# Patient Record
Sex: Female | Born: 1955 | Race: White | Hispanic: No | Smoking: Former smoker
Health system: Southern US, Community
[De-identification: ages and names within clinical notes are randomized; demographics above are authoritative.]

## PROBLEM LIST (undated history)

## (undated) DIAGNOSIS — F32A Depression, unspecified: Secondary | ICD-10-CM

## (undated) DIAGNOSIS — F329 Major depressive disorder, single episode, unspecified: Secondary | ICD-10-CM

## (undated) HISTORY — PX: TONSILLECTOMY: SUR1361

## (undated) HISTORY — PX: GALLBLADDER SURGERY: SHX652

## (undated) HISTORY — DX: Major depressive disorder, single episode, unspecified: F32.9

## (undated) HISTORY — DX: Depression, unspecified: F32.A

---

## 1998-03-15 ENCOUNTER — Ambulatory Visit (HOSPITAL_COMMUNITY): Admission: RE | Admit: 1998-03-15 | Discharge: 1998-03-15 | Payer: Self-pay | Admitting: Family Medicine

## 1999-01-17 ENCOUNTER — Ambulatory Visit (HOSPITAL_COMMUNITY): Admission: RE | Admit: 1999-01-17 | Discharge: 1999-01-17 | Payer: Self-pay | Admitting: Family Medicine

## 1999-01-17 ENCOUNTER — Encounter: Payer: Self-pay | Admitting: Family Medicine

## 2000-01-21 ENCOUNTER — Ambulatory Visit (HOSPITAL_COMMUNITY): Admission: RE | Admit: 2000-01-21 | Discharge: 2000-01-21 | Payer: Self-pay | Admitting: Family Medicine

## 2000-01-21 ENCOUNTER — Encounter: Payer: Self-pay | Admitting: Family Medicine

## 2001-02-04 ENCOUNTER — Encounter: Payer: Self-pay | Admitting: Family Medicine

## 2001-02-04 ENCOUNTER — Ambulatory Visit (HOSPITAL_COMMUNITY): Admission: RE | Admit: 2001-02-04 | Discharge: 2001-02-04 | Payer: Self-pay | Admitting: Family Medicine

## 2001-03-01 ENCOUNTER — Other Ambulatory Visit: Admission: RE | Admit: 2001-03-01 | Discharge: 2001-03-01 | Payer: Self-pay | Admitting: *Deleted

## 2002-03-16 ENCOUNTER — Ambulatory Visit (HOSPITAL_COMMUNITY): Admission: RE | Admit: 2002-03-16 | Discharge: 2002-03-16 | Payer: Self-pay | Admitting: Family Medicine

## 2002-03-16 ENCOUNTER — Encounter: Payer: Self-pay | Admitting: Family Medicine

## 2002-03-29 ENCOUNTER — Other Ambulatory Visit: Admission: RE | Admit: 2002-03-29 | Discharge: 2002-03-29 | Payer: Self-pay | Admitting: Family Medicine

## 2002-05-19 ENCOUNTER — Ambulatory Visit (HOSPITAL_COMMUNITY): Admission: RE | Admit: 2002-05-19 | Discharge: 2002-05-19 | Payer: Self-pay | Admitting: Obstetrics and Gynecology

## 2002-05-19 ENCOUNTER — Encounter (INDEPENDENT_AMBULATORY_CARE_PROVIDER_SITE_OTHER): Payer: Self-pay

## 2003-03-24 ENCOUNTER — Ambulatory Visit (HOSPITAL_COMMUNITY): Admission: RE | Admit: 2003-03-24 | Discharge: 2003-03-24 | Payer: Self-pay | Admitting: Family Medicine

## 2003-03-24 ENCOUNTER — Encounter: Payer: Self-pay | Admitting: Family Medicine

## 2003-04-04 ENCOUNTER — Other Ambulatory Visit: Admission: RE | Admit: 2003-04-04 | Discharge: 2003-04-04 | Payer: Self-pay | Admitting: *Deleted

## 2003-06-27 ENCOUNTER — Encounter: Admission: RE | Admit: 2003-06-27 | Discharge: 2003-09-25 | Payer: Self-pay | Admitting: Family Medicine

## 2004-04-18 ENCOUNTER — Ambulatory Visit (HOSPITAL_COMMUNITY): Admission: RE | Admit: 2004-04-18 | Discharge: 2004-04-18 | Payer: Self-pay | Admitting: Family Medicine

## 2004-05-01 ENCOUNTER — Other Ambulatory Visit: Admission: RE | Admit: 2004-05-01 | Discharge: 2004-05-01 | Payer: Self-pay | Admitting: *Deleted

## 2005-05-06 ENCOUNTER — Other Ambulatory Visit: Admission: RE | Admit: 2005-05-06 | Discharge: 2005-05-06 | Payer: Self-pay | Admitting: *Deleted

## 2006-05-05 ENCOUNTER — Ambulatory Visit (HOSPITAL_COMMUNITY): Admission: RE | Admit: 2006-05-05 | Discharge: 2006-05-05 | Payer: Self-pay | Admitting: Family Medicine

## 2006-10-21 ENCOUNTER — Ambulatory Visit (HOSPITAL_COMMUNITY): Admission: RE | Admit: 2006-10-21 | Discharge: 2006-10-21 | Payer: Self-pay | Admitting: Surgery

## 2006-10-21 ENCOUNTER — Encounter (INDEPENDENT_AMBULATORY_CARE_PROVIDER_SITE_OTHER): Payer: Self-pay | Admitting: Specialist

## 2007-06-10 ENCOUNTER — Ambulatory Visit (HOSPITAL_COMMUNITY): Admission: RE | Admit: 2007-06-10 | Discharge: 2007-06-10 | Payer: Self-pay | Admitting: Family Medicine

## 2008-06-28 ENCOUNTER — Ambulatory Visit (HOSPITAL_COMMUNITY): Admission: RE | Admit: 2008-06-28 | Discharge: 2008-06-28 | Payer: Self-pay | Admitting: Family Medicine

## 2010-04-28 ENCOUNTER — Emergency Department: Payer: Self-pay | Admitting: Unknown Physician Specialty

## 2010-10-20 ENCOUNTER — Encounter: Payer: Self-pay | Admitting: Family Medicine

## 2011-02-14 NOTE — Op Note (Signed)
Denise Reed, Denise Reed              ACCOUNT NO.:  [id number]   MEDICAL RECORD NO.:  [id number]          PATIENT TYPE:  AMB   LOCATION:  SDS                          FACILITY:  [facility]   PHYSICIAN:  Ardeth Sportsman, MD     DATE OF BIRTH:  04-May-1956   DATE OF PROCEDURE:  10/21/2006  DATE OF DISCHARGE:                               OPERATIVE REPORT   PRIMARY CARE PHYSICIAN:  Clovis Riley.   SURGEON:  Ardeth Sportsman, MD   ASSISTANT:  Lebron Conners, M.D.   PREOPERATIVE DIAGNOSIS:  Symptomatic cholecystolithiasis, possible  chronic cholecystitis.   POSTOPERATIVE DIAGNOSES:  1. Symptomatic cholecystolithiasis.  2. Chronic cholecystitis.   PROCEDURE PERFORMED:  Laparoscopic cholecystectomy   SPECIMENS:  Gallbladder.   DRAINS:  None.   ESTIMATED BLOOD LOSS:  Less than 5 mL.   COMPLICATIONS:  None.   INDICATIONS:  Denise Reed is a 55 year old female who has had episodes of  classic biliary colic with known gallstones.  She still has some chronic  low grade pain, concerning for chronic cholecystitis as well.   The anatomy and physiology of hepatobiliary and pancreatic function was  discussed.  Physiology of acute cholecystitis was explained and  recommendation was made for laparoscopic cholecystectomy with  intraoperative cholangiogram.  Risks such as stroke, heart attack, deep  venous thrombosis, pulmonary embolism, and death were discussed.  The  risks such as bleeding, need for transfusion, wound infection, abscess,  injury to other organs, incisional hernia, recurrent pain, bile duct  injury requiring internal/external or intraoperative reconstruction, and  other risks were discussed.  Questions answered.  She agreed to proceed.   OPERATIVE FINDINGS:  She had a small thick walled gallbladder with a few  stones within it.  The cystic duct lumen was essentially obliterated and  I could not get a catheter to cannulate more than 5 mm so cholangiogram  could not be done.   DESCRIPTION OF PROCEDURE:  Informed consent was confirmed.  The patient  underwent general anesthesia without difficulty.  She was positioned  supine, both arms tucked.  She had sequential compression devices active  during the entire case.  Her abdomen was prepped and draped in a sterile  fashion.   Entry was gained in the abdomen through a right upper quadrant stab  incision and a 5-mm port was entered using optical entry without  difficulty.  Capnoperitoneum was insufflated to 15 mmHg without any  difficulty.  Under direct visualization, 5-mm ports were placed in the  supraumbilical region and then in the right flank.  A 10-mm port was  placed in the subxiphoid region.  She did have some infraumbilical  midline adhesions, but placement of the umbilical port took care to stay  away from these areas.   Camera inspection revealed a fatty liver with steatohepatosis and a  small intrahepatic gallbladder.  There were some moderate omental  adhesions.  These were able to easily be freed off with controlled  cautery and focus dissection.  The anteromedial and posterolateral  coverings between the gallbladder and liver were freed off.  Circumferential dissection was done, such that  the proximal half of the  gallbladder was dissected free.  This left 2 structures going from the  gallbladder down to the porta hepatis.  One structure was pulsatile,  consistent with a cystic artery.  One clip on the gallbladder side, then  2 clips slightly proximal were made and cystic artery was transected.   Further skeletonization was done to prove only the cystic duct remained.  One clip on the infundibulum was made and partial cystectomy was  performed.  I felt like I was in the lumen.  Attempts were made to milk  back any stones that were within the cystic duct and there were none.  I  did not get much in the way of bile return.  Attempts were made to  cannulate the cystic duct but were not successful.  It  seemed like we  were near a valve and trying to dilate the cystic duct lumen, would not  pass.  I did another partial cystectomy about 5 mm more proximally since  I had plenty of cystic duct length and again I could not succeed in  passing it.  Given the fact that the patient did not have any obvious  gallstone pancreatitis or evidence of jaundice, I felt that it did not  require a more aggressive intervention for cholangiogram at this time,  and therefore, I aborted the cholangiogram.  The cholangiogram catheter  was removed and 4 clips were made on the remaining cystic duct stump,  taking care to stay away from the junction with the common bile duct.  The cystic duct transection was completed.  The gallbladder was freed  off from its remaining attachments from the liver and brought out the  subxiphoid fascial defect with no extra dilation.  Gallbladder was  opened longitudinally and 3 small cluster-like stones were noted up near  the fundus.  They were adherent to the mucosa.  I do not think they were  really consistent with classic polyps.   Copious irrigation was done with nice clear return.  Liver bed was  inspected numerous times and there was no evidence of any leak of bile,  nor any bleeding.  Clips were intact on the cystic duct and arterial  stumps.  The ports were removed, except for the right lateral port and  there was no bleeding from these 3 ports and when the port was removed  there was no bleeding of the fascia either.  Capnoperitoneum was  completely evacuated.  The fascial defect in the subxiphoid region was  too small to allow my pinky to pass and it was tunneled through the  falciform ligament, so I felt it did not require more aggressive  closure.  Skin was closed using 4-0 Monocryl.  Sterile dressing applied.  The patient was extubated and sent to recovery room in stable condition.  I explained the operative findings to the patient's friend, per her   request.      Ardeth Sportsman, MD  Electronically Signed     SCG/MEDQ  D:  10/21/2006  T:  10/21/2006  Job:  [id number]   cc:   Clovis Riley, FNP

## 2011-02-14 NOTE — Op Note (Signed)
NAME:  Denise Reed, Denise Reed                        ACCOUNT NO.:  [id number]   MEDICAL RECORD NO.:  [id number]                   PATIENT TYPE:  AMB   LOCATION:  SDC                                  FACILITY:  [facility]   PHYSICIAN:  Daniel L. Eda Paschal, M.D.           DATE OF BIRTH:  1956/08/30   DATE OF PROCEDURE:  05/19/2002  DATE OF DISCHARGE:                                 OPERATIVE REPORT   PREOPERATIVE DIAGNOSES:  Postmenopausal bleeding.   POSTOPERATIVE DIAGNOSES:  Postmenopausal bleeding.   NAME OF OPERATION:  Hysteroscopy, dilatation and curettage.   SURGEON:  Daniel L. Eda Paschal, M.D.   ANESTHESIA:  General.   INDICATIONS:  The patient is a 55 year old female who had presented to the  office with postmenopausal bleeding.  Endometrial biopsy was benign but on  sonohysterogram, although no specific lesion could be identified, the  patient had an enlarged stripe of almost 9 mm.  The patient has a mother  with uterine cancer and based on the above it was felt it would be more  prudent to proceed with hysteroscopy with additional endometrial samplings.   FINDINGS:  External and vaginal is within normal limits.  Cervix is clean.  Uterus is mid position, normal size and shape without any descensus.  Adnexa  are not palpable.  At the time of hysteroscopy the patient's cavity was  completely free of any disease.  The entire cavity could be adequately  evaluated.   PROCEDURE:  After adequate general anesthesia the patient was placed in the  dorsal lithotomy position, prepped and draped in the usual sterile manner.  Single tooth tenaculum was placed in the anterior lip of the cervix and the  cervix was dilated to a 33 Pratt dilator.  Initially, the resectoscope was  attempted to insert and even though the cervix had been dilated, it was  difficult to get the resectoscope past the internal os into the cavity.  It  appeared that what had happened was was that the external and most of  the  cervix had been dilated but the internal os had not been fully dilated.  At  this point we switched to a diagnostic hysteroscope.  It was attached to the  camera for magnification.  Sorbitol 3% to expand the intrauterine cavity and  this could comfortably enter the endometrial cavity.  The entire cavity  could be evaluated and there was no pathology.  Pictures were taken for  documentation.  The hysteroscope was removed.  Endometrial samplings were  obtained which were scant, consistent with her postmenopausal state not on  hormones.  She was rehysteroscoped.  There was no active bleeding.  Blood  loss was less than 50 cc with none replaced.  Fluid deficit was 240 cc,  although there was a significant spill of fluid on the floor.  The patient  left the operating room in satisfactory condition.  Daniel L. Eda Paschal, M.D.    Tonette Bihari  D:  05/19/2002  T:  05/20/2002  Job:  [id number]

## 2013-05-05 ENCOUNTER — Encounter: Payer: Self-pay | Admitting: Women's Health

## 2013-05-05 ENCOUNTER — Other Ambulatory Visit (HOSPITAL_COMMUNITY)
Admission: RE | Admit: 2013-05-05 | Discharge: 2013-05-05 | Disposition: A | Discharge status: Home / Self Care | Payer: 59 | Source: Ambulatory Visit | Attending: Gynecology | Admitting: Gynecology

## 2013-05-05 ENCOUNTER — Ambulatory Visit (INDEPENDENT_AMBULATORY_CARE_PROVIDER_SITE_OTHER): Payer: 59 | Admitting: Women's Health

## 2013-05-05 VITALS — BP 125/90 | Ht 65.0 in | Wt 260.0 lb

## 2013-05-05 DIAGNOSIS — N951 Menopausal and female climacteric states: Secondary | ICD-10-CM

## 2013-05-05 DIAGNOSIS — F418 Other specified anxiety disorders: Secondary | ICD-10-CM

## 2013-05-05 DIAGNOSIS — Z833 Family history of diabetes mellitus: Secondary | ICD-10-CM

## 2013-05-05 DIAGNOSIS — F341 Dysthymic disorder: Secondary | ICD-10-CM

## 2013-05-05 DIAGNOSIS — Z780 Asymptomatic menopausal state: Secondary | ICD-10-CM

## 2013-05-05 DIAGNOSIS — Z01419 Encounter for gynecological examination (general) (routine) without abnormal findings: Secondary | ICD-10-CM | POA: Insufficient documentation

## 2013-05-05 DIAGNOSIS — Z13220 Encounter for screening for lipoid disorders: Secondary | ICD-10-CM

## 2013-05-05 DIAGNOSIS — E079 Disorder of thyroid, unspecified: Secondary | ICD-10-CM

## 2013-05-05 LAB — CBC WITH DIFFERENTIAL/PLATELET
Eosinophils Absolute: 0.1 10*3/uL (ref 0.0–0.7)
Hemoglobin: 13.4 g/dL (ref 12.0–15.0)
Lymphocytes Relative: 26 % (ref 12–46)
Lymphs Abs: 1.2 10*3/uL (ref 0.7–4.0)
MCH: 28.5 pg (ref 26.0–34.0)
Monocytes Relative: 8 % (ref 3–12)
Neutrophils Relative %: 64 % (ref 43–77)
RBC: 4.7 MIL/uL (ref 3.87–5.11)

## 2013-05-05 NOTE — Progress Notes (Signed)
Denise Reed 04-Mar-1956 [id number]    History:    The patient presents for annual exam.  Postmenopausal/no HRT/no bleeding. History of normal Paps and mammograms. Has not had a colonoscopy or DEXA.  Last mammogram since 02/14/2008, minimal healthcare in the last few years caring for aging parents. Both parents died 2012/02/14 from cancer, mother with breast cancer and esophageal, father lung. History of depression and anxiety on Wellbutrin and Zoloft per psychiatrist.   Past medical history, past surgical history, family history and social history were all reviewed and documented in the EPIC chart. Works in a Engineer, agricultural for physician's. Cholecystectomy 02-14-2007. 13 year old daughter lives in [place], 42 year old son lives in [place].   ROS:  A  ROS was performed and pertinent positives and negatives are included in the history.  Exam:  Filed Vitals:   05/05/13 1505  BP: 125/90    General appearance:  Normal Head/Neck:  Normal, without cervical or supraclavicular adenopathy. Thyroid:  Symmetrical, normal in size, without palpable masses or nodularity. Respiratory  Effort:  Normal  Auscultation:  Clear without wheezing or rhonchi Cardiovascular  Auscultation:  Regular rate, without rubs, murmurs or gallops  Edema/varicosities:  Not grossly evident Abdominal  Soft,nontender, without masses, guarding or rebound.  Liver/spleen:  No organomegaly noted  Hernia:  None appreciated  Skin  Inspection:  Grossly normal  Palpation:  Grossly normal Neurologic/psychiatric  Orientation:  Normal with appropriate conversation.  Mood/affect:  Normal  Genitourinary    Breasts: Examined lying and sitting.     Right: Without masses, retractions, discharge or axillary adenopathy.     Left: Without masses, retractions, discharge or axillary adenopathy.   Inguinal/mons:  Normal without inguinal adenopathy  External genitalia:  Normal  BUS/Urethra/Skene's glands:  Normal  Bladder:  Normal  Vagina:   Normal  Cervix:  Normal  Uterus:   normal in size, shape and contour.  Midline and mobile  Adnexa/parametria:     Rt: Without masses or tenderness.   Lt: Without masses or tenderness.  Anus and perineum: Normal  Digital rectal exam: Normal sphincter tone without palpated masses or tenderness  Assessment/Plan:  57 y.o. DWF G2P2  for annual exam.     Blood pressure 129/90 Minimal healthcare in several years Morbid obesity Anxiety/depression-stable on meds per psychiatrist and counselor  Plan: SBE's, schedule mammogram, reviewed importance of annual screen. Colonoscopy, reports will schedule. Continue healthy her lifestyle with diet and exercise reports losing 30 pounds over the past year. Vitamin D 2000 daily encouraged, home safety and fall prevention discussed. Schedule a DEXA will schedule here. Condoms encouraged if become sexually active. CBC, comprehensive metabolic, lipid panel, TSH, UA, Pap. Instructed to check blood pressure away from office if continues greater than 130/80 followup with primary care.   Harrington Challenger [facility], 4:01 PM 05/05/2013

## 2013-05-05 NOTE — Patient Instructions (Signed)
Dexa Colonoscopy mammogram    Health Recommendations for Postmenopausal Women Respected and ongoing research has looked at the most common causes of death, disability, and poor quality of life in postmenopausal women. The causes include heart disease, diseases of blood vessels, diabetes, depression, cancer, and bone loss (osteoporosis). Many things can be done to help lower the chances of developing these and other common problems: CARDIOVASCULAR DISEASE Heart Disease: A heart attack is a medical emergency. Know the signs and symptoms of a heart attack. Below are things women can do to reduce their risk for heart disease.   Do not smoke. If you smoke, quit.  Aim for a healthy weight. Being overweight causes many preventable deaths. Eat a healthy and balanced diet and drink an adequate amount of liquids.  Get moving. Make a commitment to be more physically active. Aim for 30 minutes of activity on most, if not all days of the week.  Eat for heart health. Choose a diet that is low in saturated fat and cholesterol and eliminate trans fat. Include whole grains, vegetables, and fruits. Read and understand the labels on food containers before buying.  Know your numbers. Ask your caregiver to check your blood pressure, cholesterol (total, HDL, LDL, triglycerides) and blood glucose. Work with your caregiver on improving your entire clinical picture.  High blood pressure. Limit or stop your table salt intake (try salt substitute and food seasonings). Avoid salty foods and drinks. Read labels on food containers before buying. Eating well and exercising can help control high blood pressure. STROKE    Stroke is a medical emergency. Stroke may be the result of a blood clot in a blood vessel in the brain or by a brain hemorrhage (bleeding). Know the signs and symptoms of a stroke. To lower the risk of developing a stroke:  Avoid fatty foods.  Quit smoking.  Control your diabetes, blood pressure, and  irregular heart rate. THROMBOPHLEBITIS (BLOOD CLOT) OF THE LEG  Becoming overweight and leading a stationary lifestyle may also contribute to developing blood clots. Controlling your diet and exercising will help lower the risk of developing blood clots. CANCER SCREENING  Breast Cancer: Take steps to reduce your risk of breast cancer.  You should practice "breast self-awareness." This means understanding the normal appearance and feel of your breasts and should include breast self-examination. Any changes detected, no matter how small, should be reported to your caregiver.  After age 6, you should have a clinical breast exam (CBE) every year.  Starting at age 48, you should consider having a mammogram (breast X-ray) every year.  If you have a family history of breast cancer, talk to your caregiver about genetic screening.  If you are at high risk for breast cancer, talk to your caregiver about having an MRI and a mammogram every year.  Intestinal or Stomach Cancer: Tests to consider are a rectal exam, fecal occult blood, sigmoidoscopy, and colonoscopy. Women who are high risk may need to be screened at an earlier age and more often.  Cervical Cancer:  Beginning at age 59, you should have a Pap test every 3 years as long as the past 3 Pap tests have been normal.  If you have had past treatment for cervical cancer or a condition that could lead to cancer, you need Pap tests and screening for cancer for at least 20 years after your treatment.  If you had a hysterectomy for a problem that was not cancer or a condition that could lead to cancer,  then you no longer need Pap tests.  If you are between ages 22 and 64, and you have had normal Pap tests going back 10 years, you no longer need Pap tests.  If Pap tests have been discontinued, risk factors (such as a new sexual partner) need to be reassessed to determine if screening should be resumed.  Some medical problems can increase the  chance of getting cervical cancer. In these cases, your caregiver may recommend more frequent screening and Pap tests.  Uterine Cancer: If you have vaginal bleeding after reaching menopause, you should notify your caregiver.  Ovarian cancer: Other than yearly pelvic exams, there are no reliable tests available to screen for ovarian cancer at this time except for yearly pelvic exams.  Lung Cancer: Yearly chest X-rays can detect lung cancer and should be done on high risk women, such as cigarette smokers and women with chronic lung disease (emphysema).  Skin Cancer: A complete body skin exam should be done at your yearly examination. Avoid overexposure to the sun and ultraviolet light lamps. Use a strong sun block cream when in the sun. All of these things are important in lowering the risk of skin cancer. MENOPAUSE Menopause Symptoms: Hormone therapy products are effective for treating symptoms associated with menopause:  Moderate to severe hot flashes.  Night sweats.  Mood swings.  Headaches.  Tiredness.  Loss of sex drive.  Insomnia.  Other symptoms. Hormone replacement carries certain risks, especially in older women. Women who use or are thinking about using estrogen or estrogen with progestin treatments should discuss that with their caregiver. Your caregiver will help you understand the benefits and risks. The ideal dose of hormone replacement therapy is not known. The Food and Drug Administration (FDA) has concluded that hormone therapy should be used only at the lowest doses and for the shortest amount of time to reach treatment goals.  OSTEOPOROSIS Protecting Against Bone Loss and Preventing Fracture: If you use hormone therapy for prevention of bone loss (osteoporosis), the risks for bone loss must outweigh the risk of the therapy. Ask your caregiver about other medications known to be safe and effective for preventing bone loss and fractures. To guard against bone loss or  fractures, the following is recommended:  If you are less than age 75, take 1000 mg of calcium and at least 600 mg of Vitamin D per day.  If you are greater than age 23 but less than age 64, take 1200 mg of calcium and at least 600 mg of Vitamin D per day.  If you are greater than age 76, take 1200 mg of calcium and at least 800 mg of Vitamin D per day. Smoking and excessive alcohol intake increases the risk of osteoporosis. Eat foods rich in calcium and vitamin D and do weight bearing exercises several times a week as your caregiver suggests. DIABETES Diabetes Melitus: If you have Type I or Type 2 diabetes, you should keep your blood sugar under control with diet, exercise and recommended medication. Avoid too many sweets, starchy and fatty foods. Being overweight can make control more difficult. COGNITION AND MEMORY Cognition and Memory: Menopausal hormone therapy is not recommended for the prevention of cognitive disorders such as Alzheimer's disease or memory loss.  DEPRESSION  Depression may occur at any age, but is common in elderly women. The reasons may be because of physical, medical, social (loneliness), or financial problems and needs. If you are experiencing depression because of medical problems and control of symptoms, talk  to your caregiver about this. Physical activity and exercise may help with mood and sleep. Community and volunteer involvement may help your sense of value and worth. If you have depression and you feel that the problem is getting worse or becoming severe, talk to your caregiver about treatment options that are best for you. ACCIDENTS  Accidents are common and can be serious in the elderly woman. Prepare your house to prevent accidents. Eliminate throw rugs, place hand bars in the bath, shower and toilet areas. Avoid wearing high heeled shoes or walking on wet, snowy, and icy areas. Limit or stop driving if you have vision or hearing problems, or you feel you are  unsteady with you movements and reflexes. HEPATITIS C Hepatitis C is a type of viral infection affecting the liver. It is spread mainly through contact with blood from an infected person. It can be treated, but if left untreated, it can lead to severe liver damage over years. Many people who are infected do not know that the virus is in their blood. If you are a "baby-boomer", it is recommended that you have one screening test for Hepatitis C. IMMUNIZATIONS  Several immunizations are important to consider having during your senior years, including:   Tetanus, diptheria, and pertussis booster shot.  Influenza every year before the flu season begins.  Pneumonia vaccine.  Shingles vaccine.  Others as indicated based on your specific needs. Talk to your caregiver about these. Document Released: 11/07/2005 Document Revised: 09/01/2012 Document Reviewed: 07/03/2008 [facility] Patient Information 2014 [place], [place].

## 2013-05-06 LAB — URINALYSIS W MICROSCOPIC + REFLEX CULTURE
Bilirubin Urine: NEGATIVE
Hgb urine dipstick: NEGATIVE
Ketones, ur: NEGATIVE mg/dL
Protein, ur: NEGATIVE mg/dL
Urobilinogen, UA: 0.2 mg/dL (ref 0.0–1.0)

## 2013-05-06 LAB — COMPREHENSIVE METABOLIC PANEL
AST: 23 U/L (ref 0–37)
Albumin: 4.5 g/dL (ref 3.5–5.2)
Alkaline Phosphatase: 42 U/L (ref 39–117)
Potassium: 4.2 mEq/L (ref 3.5–5.3)
Sodium: 142 mEq/L (ref 135–145)
Total Bilirubin: 0.3 mg/dL (ref 0.3–1.2)
Total Protein: 7.1 g/dL (ref 6.0–8.3)

## 2013-05-06 LAB — LIPID PANEL
Cholesterol: 241 mg/dL — ABNORMAL HIGH (ref 0–200)
HDL: 59 mg/dL (ref >39)
Total CHOL/HDL Ratio: 4.1 Ratio
Triglycerides: 167 mg/dL — ABNORMAL HIGH (ref <150)
VLDL: 33 mg/dL (ref 0–40)

## 2013-05-09 ENCOUNTER — Other Ambulatory Visit: Payer: Self-pay

## 2013-05-09 DIAGNOSIS — Z1231 Encounter for screening mammogram for malignant neoplasm of breast: Secondary | ICD-10-CM

## 2013-05-13 ENCOUNTER — Other Ambulatory Visit: Payer: Self-pay | Admitting: Women's Health

## 2013-05-13 DIAGNOSIS — E7800 Pure hypercholesterolemia, unspecified: Secondary | ICD-10-CM

## 2013-05-16 ENCOUNTER — Encounter: Payer: Self-pay | Admitting: Women's Health

## 2013-06-01 ENCOUNTER — Ambulatory Visit
Admission: RE | Admit: 2013-06-01 | Discharge: 2013-06-01 | Disposition: A | Discharge status: Home / Self Care | Payer: 59 | Source: Ambulatory Visit

## 2013-06-01 DIAGNOSIS — Z1231 Encounter for screening mammogram for malignant neoplasm of breast: Secondary | ICD-10-CM

## 2013-08-04 ENCOUNTER — Other Ambulatory Visit: Payer: Self-pay

## 2014-05-04 ENCOUNTER — Other Ambulatory Visit: Payer: Self-pay | Admitting: Gastroenterology

## 2014-05-04 DIAGNOSIS — R1310 Dysphagia, unspecified: Secondary | ICD-10-CM

## 2014-05-04 DIAGNOSIS — R7989 Other specified abnormal findings of blood chemistry: Secondary | ICD-10-CM

## 2014-05-11 ENCOUNTER — Ambulatory Visit
Admission: RE | Admit: 2014-05-11 | Discharge: 2014-05-11 | Disposition: A | Discharge status: Home / Self Care | Payer: 59 | Source: Ambulatory Visit | Attending: Gastroenterology | Admitting: Gastroenterology

## 2014-05-11 DIAGNOSIS — R7989 Other specified abnormal findings of blood chemistry: Secondary | ICD-10-CM

## 2014-05-11 DIAGNOSIS — R1310 Dysphagia, unspecified: Secondary | ICD-10-CM

## 2014-06-20 ENCOUNTER — Other Ambulatory Visit: Payer: Self-pay

## 2014-06-20 DIAGNOSIS — Z1231 Encounter for screening mammogram for malignant neoplasm of breast: Secondary | ICD-10-CM

## 2014-07-05 ENCOUNTER — Encounter: Payer: Self-pay | Admitting: Women's Health

## 2014-07-05 ENCOUNTER — Ambulatory Visit
Admission: RE | Admit: 2014-07-05 | Discharge: 2014-07-05 | Disposition: A | Discharge status: Home / Self Care | Payer: 59 | Source: Ambulatory Visit

## 2014-07-05 DIAGNOSIS — Z1231 Encounter for screening mammogram for malignant neoplasm of breast: Secondary | ICD-10-CM

## 2014-07-31 ENCOUNTER — Encounter: Payer: Self-pay | Admitting: Women's Health

## 2015-07-11 IMAGING — RF DG ESOPHAGUS
12 of 15 series · 19 of 24 positions shown · non-contrast
Comparison: CT abdomen pelvis of 08/27/2006

CLINICAL DATA: Dysphagia

EXAM:
ESOPHOGRAM / BARIUM SWALLOW / BARIUM TABLET STUDY
TECHNIQUE: Combined double contrast and single contrast examination performed
using effervescent crystals, thick barium liquid, and thin barium
liquid. The patient was observed with fluoroscopy swallowing a 13mm
barium sulphate tablet.
FLUOROSCOPY TIME:  1 min 24 seconds

[Series 2: run · 1 of 1 slices shown (1 of 12)]
[im 1/1]
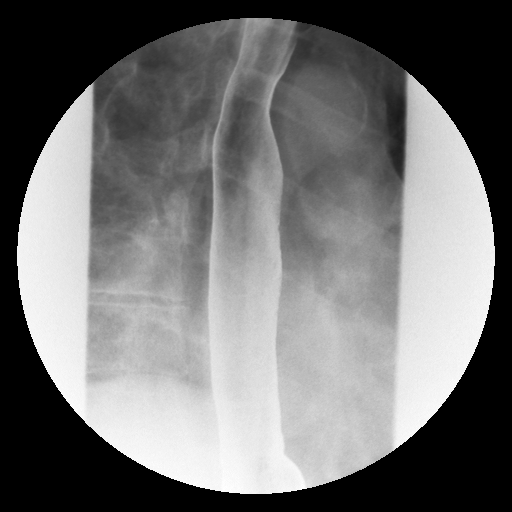

[Series 3: run · 1 of 1 slices shown (2 of 12)]
[im 1/1]
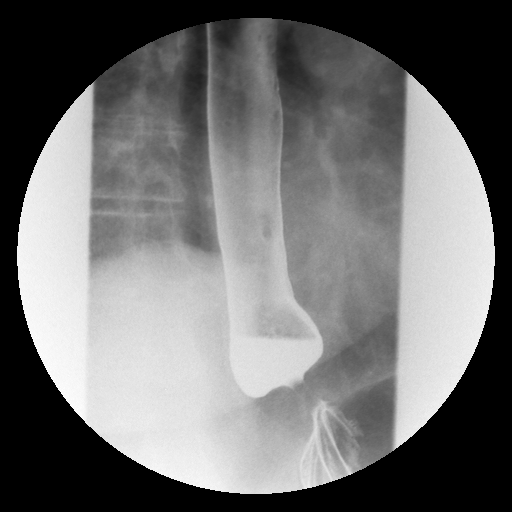

[Series 5: run · 1 of 1 slices shown (3 of 12)]
[im 1/1]
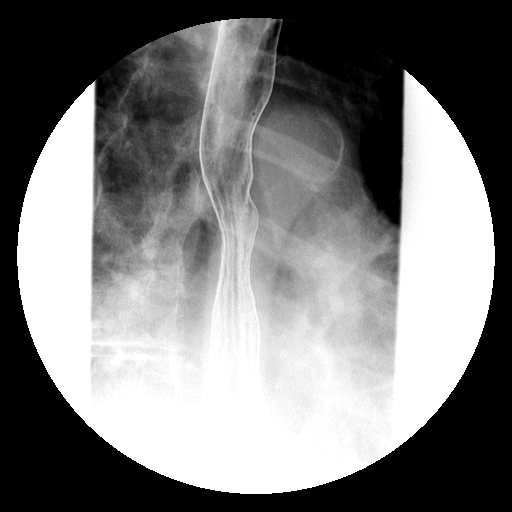

[Series 6: run · 1 of 1 slices shown (4 of 12)]
[im 1/1]
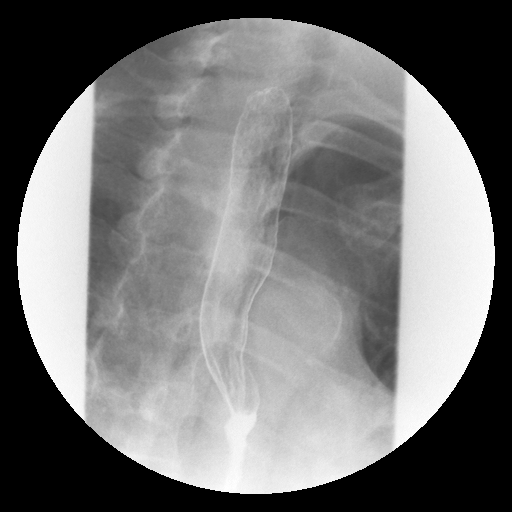

[Series 7: run · 3 of 8 slices shown (5 of 12)]
[im 1/8]
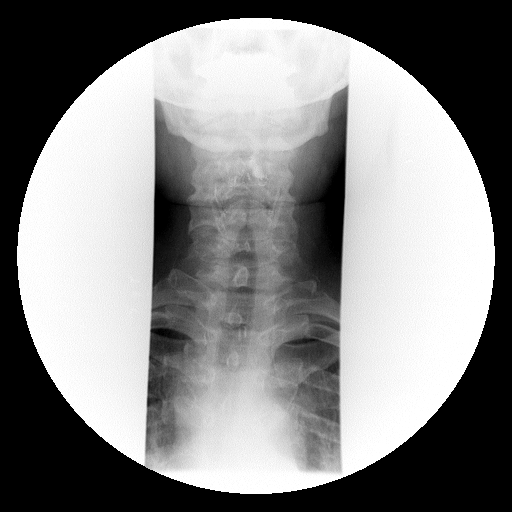
[im 3/8]
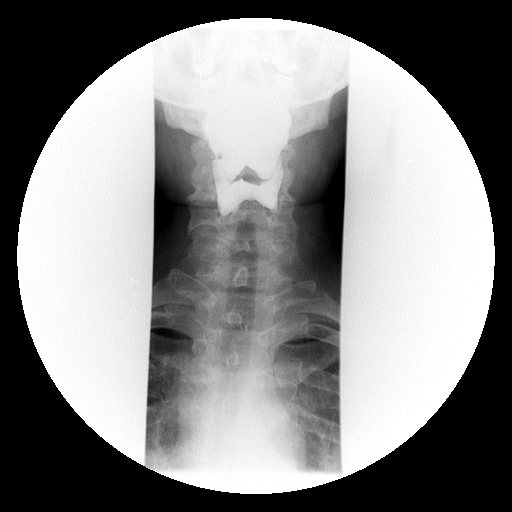
[im 8/8]
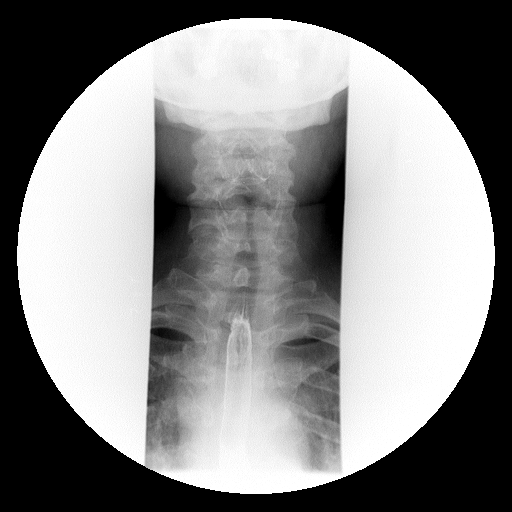

[Series 8: run · 6 of 10 slices shown (6 of 12)]
[im 1/10]
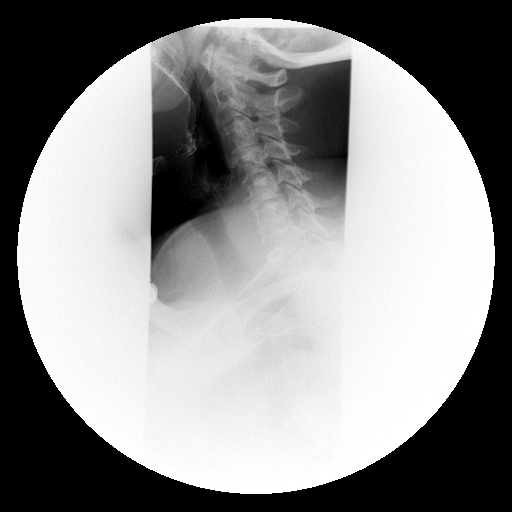
[im 2/10]
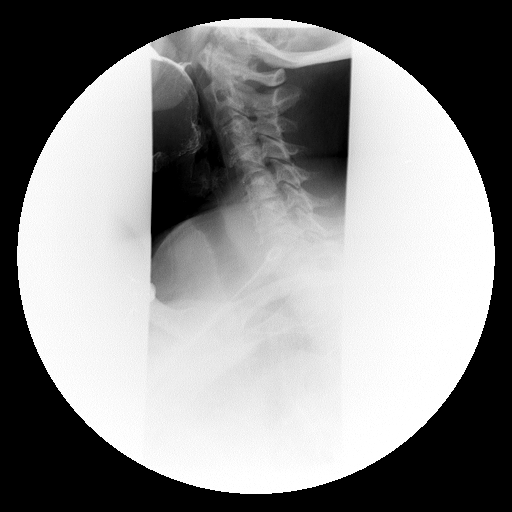
[im 5/10]
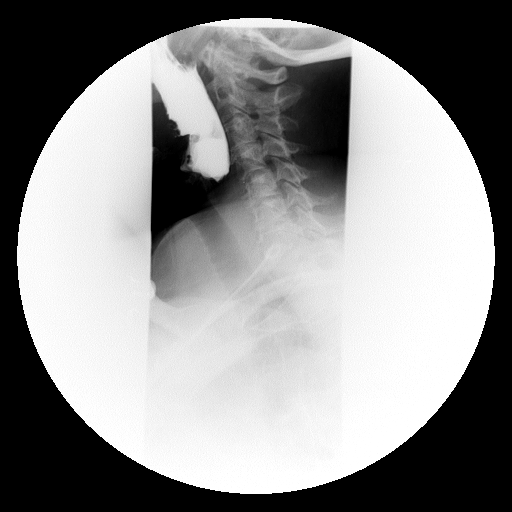
[im 7/10]
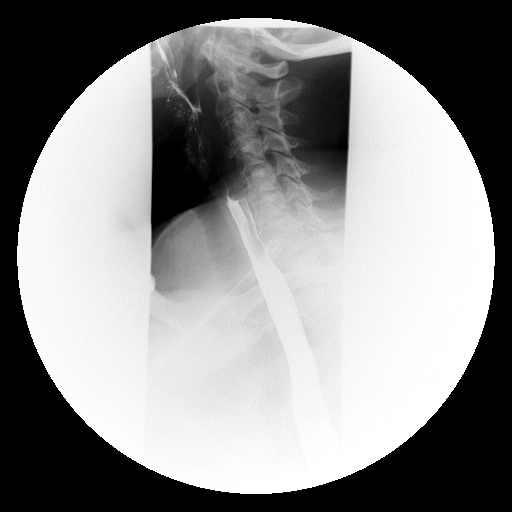
[im 8/10]
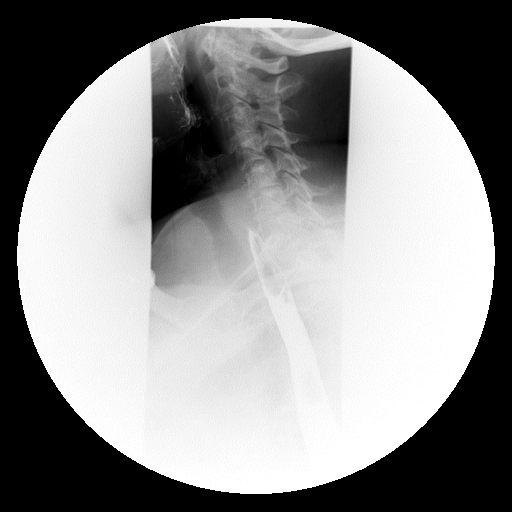
[im 10/10]
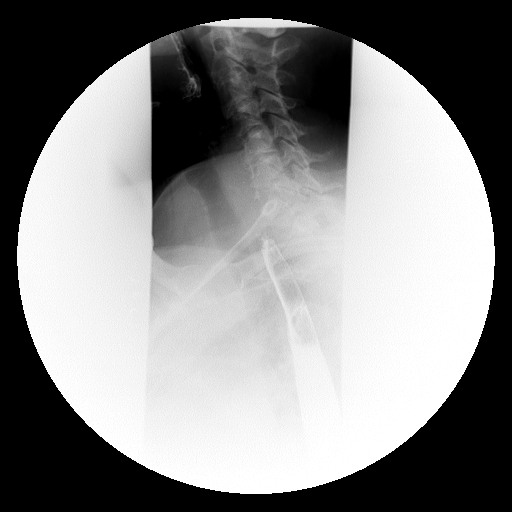

[Series 11: run · 1 of 1 slices shown (7 of 12)]
[im 1/1]
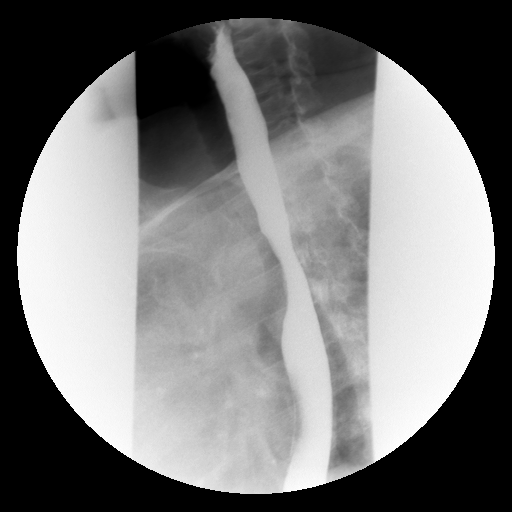

[Series 12: run · 1 of 1 slices shown (8 of 12)]
[im 1/1]
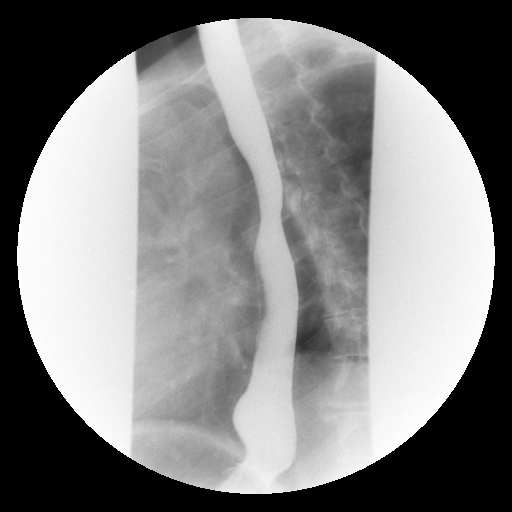

[Series 13: run · 1 of 1 slices shown (9 of 12)]
[im 1/1]
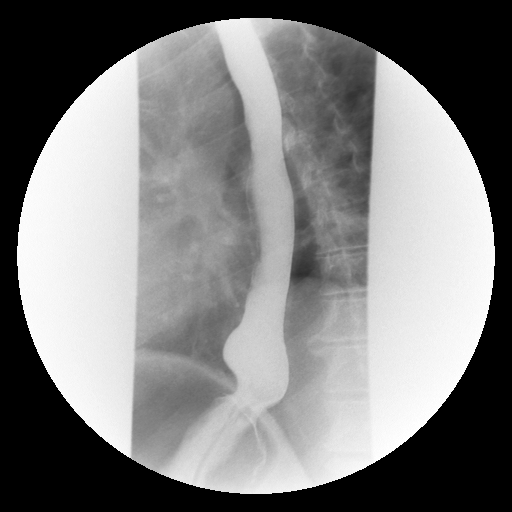

[Series 14: run · 1 of 1 slices shown (10 of 12)]
[im 1/1]
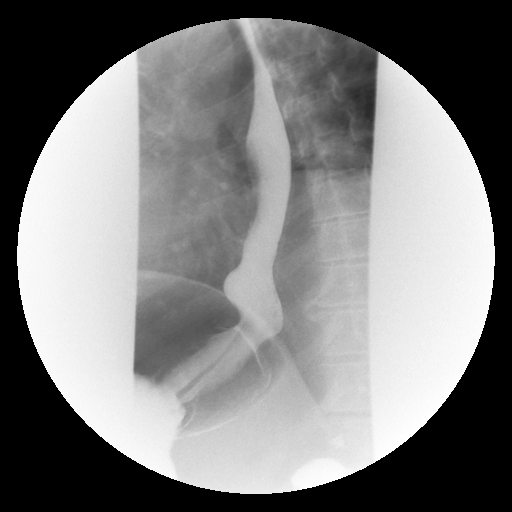

[Series 16: run · 1 of 1 slices shown (11 of 12)]
[im 1/1]
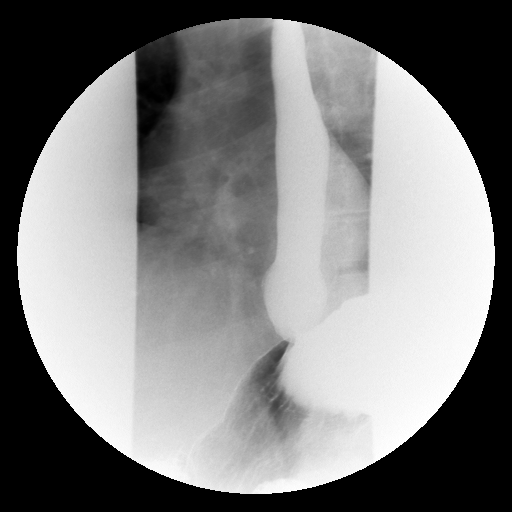

[Series 17: run · 1 of 1 slices shown (12 of 12)]
[im 1/1]
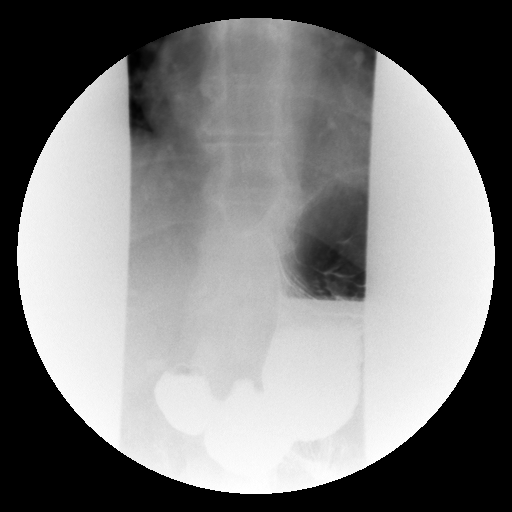

[19 of 24 positions shown; findings below may reference images not displayed]

FINDINGS: A double-contrast barium swallow was performed. The mucosa of the
esophagus is unremarkable. A single contrast study shows the
swallowing mechanism to be normal. Esophageal peristalsis is normal.
There is some prominence of the cricopharyngeus muscle noted. No
hiatal hernia is seen. However, there is significant
gastroesophageal reflux demonstrated. A barium pill was given at the
end of the study which passed into the stomach without delay.
IMPRESSION: 1. Significant gastroesophageal reflux.  No definite hiatal hernia.
2. Barium pill passes into the stomach without delay.
3. Prominent cricopharyngeus muscle.

## 2015-09-04 ENCOUNTER — Other Ambulatory Visit: Payer: Self-pay

## 2015-09-04 DIAGNOSIS — Z1231 Encounter for screening mammogram for malignant neoplasm of breast: Secondary | ICD-10-CM

## 2015-09-04 DIAGNOSIS — Z803 Family history of malignant neoplasm of breast: Secondary | ICD-10-CM

## 2015-10-17 ENCOUNTER — Ambulatory Visit
Admission: RE | Admit: 2015-10-17 | Discharge: 2015-10-17 | Disposition: A | Discharge status: Home / Self Care | Payer: 59 | Source: Ambulatory Visit

## 2015-10-17 ENCOUNTER — Encounter: Payer: Self-pay | Admitting: Women's Health

## 2015-10-17 DIAGNOSIS — Z803 Family history of malignant neoplasm of breast: Secondary | ICD-10-CM

## 2015-10-17 DIAGNOSIS — Z1231 Encounter for screening mammogram for malignant neoplasm of breast: Secondary | ICD-10-CM

## 2016-11-03 ENCOUNTER — Other Ambulatory Visit: Payer: Self-pay | Admitting: Family Medicine

## 2016-11-03 DIAGNOSIS — Z1231 Encounter for screening mammogram for malignant neoplasm of breast: Secondary | ICD-10-CM

## 2016-11-26 ENCOUNTER — Ambulatory Visit
Admission: RE | Admit: 2016-11-26 | Discharge: 2016-11-26 | Disposition: A | Discharge status: Home / Self Care | Payer: 59 | Source: Ambulatory Visit | Attending: Family Medicine | Admitting: Family Medicine

## 2016-11-26 DIAGNOSIS — Z1231 Encounter for screening mammogram for malignant neoplasm of breast: Secondary | ICD-10-CM

## 2017-09-29 HISTORY — PX: BREAST BIOPSY: SHX20

## 2017-12-01 ENCOUNTER — Other Ambulatory Visit: Payer: Self-pay | Admitting: Family Medicine

## 2017-12-01 DIAGNOSIS — Z139 Encounter for screening, unspecified: Secondary | ICD-10-CM

## 2017-12-23 ENCOUNTER — Ambulatory Visit
Admission: RE | Admit: 2017-12-23 | Discharge: 2017-12-23 | Disposition: A | Discharge status: Home / Self Care | Payer: 59 | Source: Ambulatory Visit | Attending: Family Medicine | Admitting: Family Medicine

## 2017-12-23 DIAGNOSIS — Z139 Encounter for screening, unspecified: Secondary | ICD-10-CM

## 2017-12-24 ENCOUNTER — Other Ambulatory Visit: Payer: Self-pay | Admitting: Family Medicine

## 2017-12-24 DIAGNOSIS — R928 Other abnormal and inconclusive findings on diagnostic imaging of breast: Secondary | ICD-10-CM

## 2017-12-30 ENCOUNTER — Other Ambulatory Visit: Payer: Self-pay | Admitting: Family Medicine

## 2017-12-30 ENCOUNTER — Ambulatory Visit
Admission: RE | Admit: 2017-12-30 | Discharge: 2017-12-30 | Disposition: A | Discharge status: Home / Self Care | Payer: 59 | Source: Ambulatory Visit | Attending: Family Medicine | Admitting: Family Medicine

## 2017-12-30 DIAGNOSIS — R928 Other abnormal and inconclusive findings on diagnostic imaging of breast: Secondary | ICD-10-CM

## 2017-12-30 DIAGNOSIS — N6310 Unspecified lump in the right breast, unspecified quadrant: Secondary | ICD-10-CM

## 2018-01-01 ENCOUNTER — Ambulatory Visit
Admission: RE | Admit: 2018-01-01 | Discharge: 2018-01-01 | Disposition: A | Discharge status: Home / Self Care | Payer: 59 | Source: Ambulatory Visit | Attending: Family Medicine | Admitting: Family Medicine

## 2018-01-01 DIAGNOSIS — N6310 Unspecified lump in the right breast, unspecified quadrant: Secondary | ICD-10-CM

## 2018-10-27 ENCOUNTER — Encounter: Payer: Self-pay | Admitting: Women's Health

## 2018-10-27 ENCOUNTER — Ambulatory Visit: Payer: 59 | Admitting: Women's Health

## 2018-10-27 VITALS — BP 134/78 | Ht 65.0 in | Wt 320.0 lb

## 2018-10-27 DIAGNOSIS — R3989 Other symptoms and signs involving the genitourinary system: Secondary | ICD-10-CM | POA: Diagnosis not present

## 2018-10-27 DIAGNOSIS — M5440 Lumbago with sciatica, unspecified side: Secondary | ICD-10-CM

## 2018-10-27 NOTE — Progress Notes (Signed)
63 yo SWF, G2P2  presents left lower and sacral back pain and lower vaginal pressure for months .  Reports this pain has gotten worse over the past 2 weeks, takes ibuprofen for pain at times with minimal relief.  Denies urinary symptoms, no burning, no frequency, no discharge, leaks some urine. Denies nausea,  vomiting, diarrhea,or fever.  Reports lower back is also worse when she moves a certain way, especially with bending.  Reports she is afraid her bladder might have "fallen".   Postmenopausal on no HRT, no bleeding and not sexually active.  Normal Pap and mammogram history.  Medical problems of anxiety/depression and hypertension managed by  primary care.  Has gained 10 pounds over the past year.  Exam: Appears well, obese.  Visible perspiration on external genitalia, no visible prolapse, speculum exam: no visible discharge or erythema,  uterus well supported cervix closed and firm.  Mild dryness, some vaginal atrophy.  Morbid obesity Possible sciatica/left lower back pain Lower back strain   Plan:  Reviewed stretching, lower back exercises to relieve back pain, encouraged yoga classes.  Discussed  the importance of increased physical activity to promote weight loss and reduce back pain and strain.  Encouraged follow-up with orthopedist if continued low back pain.  Reviewed abdominal weight causes bladder pressure and occasionally some bladder leakage after menopause.  Discussed with patient possible weight loss surgery.  Encouraged patient to eat a low carbohydrate/diabetic diet to decrease calories in order to reduce weight.

## 2018-10-27 NOTE — Patient Instructions (Signed)
Acute Back Pain, Adult Acute back pain is sudden and usually short-lived. It is often caused by an injury to the muscles and tissues in the back. The injury may result from:  A muscle or ligament getting overstretched or torn (strained). Ligaments are tissues that connect bones to each other. Lifting something improperly can cause a back strain.  Wear and tear (degeneration) of the spinal disks. Spinal disks are circular tissue that provides cushioning between the bones of the spine (vertebrae).  Twisting motions, such as while playing sports or doing yard work.  A hit to the back.  Arthritis. You may have a physical exam, lab tests, and imaging tests to find the cause of your pain. Acute back pain usually goes away with rest and home care. Follow these instructions at home: Managing pain, stiffness, and swelling  Take over-the-counter and prescription medicines only as told by your health care provider.  Your health care provider may recommend applying ice during the first 24-48 hours after your pain starts. To do this: ? Put ice in a plastic bag. ? Place a towel between your skin and the bag. ? Leave the ice on for 20 minutes, 2-3 times a day.  If directed, apply heat to the affected area as often as told by your health care provider. Use the heat source that your health care provider recommends, such as a moist heat pack or a heating pad. ? Place a towel between your skin and the heat source. ? Leave the heat on for 20-30 minutes. ? Remove the heat if your skin turns bright red. This is especially important if you are unable to feel pain, heat, or cold. You have a greater risk of getting burned. Activity   Do not stay in bed. Staying in bed for more than 1-2 days can delay your recovery.  Sit up and stand up straight. Avoid leaning forward when you sit, or hunching over when you stand. ? If you work at a desk, sit close to it so you do not need to lean over. Keep your chin tucked  in. Keep your neck drawn back, and keep your elbows bent at a right angle. Your arms should look like the letter "L." ? Sit high and close to the steering wheel when you drive. Add lower back (lumbar) support to your car seat, if needed.  Take short walks on even surfaces as soon as you are able. Try to increase the length of time you walk each day.  Do not sit, drive, or stand in one place for more than 30 minutes at a time. Sitting or standing for long periods of time can put stress on your back.  Do not drive or use heavy machinery while taking prescription pain medicine.  Use proper lifting techniques. When you bend and lift, use positions that put less stress on your back: ? Bend your knees. ? Keep the load close to your body. ? Avoid twisting.  Exercise regularly as told by your health care provider. Exercising helps your back heal faster and helps prevent back injuries by keeping muscles strong and flexible.  Work with a physical therapist to make a safe exercise program, as recommended by your health care provider. Do any exercises as told by your physical therapist. Lifestyle  Maintain a healthy weight. Extra weight puts stress on your back and makes it difficult to have good posture.  Avoid activities or situations that make you feel anxious or stressed. Stress and anxiety increase muscle   tension and can make back pain worse. Learn ways to manage anxiety and stress, such as through exercise. General instructions  Sleep on a firm mattress in a comfortable position. Try lying on your side with your knees slightly bent. If you lie on your back, put a pillow under your knees.  Follow your treatment plan as told by your health care provider. This may include: ? Cognitive or behavioral therapy. ? Acupuncture or massage therapy. ? Meditation or yoga. Contact a health care provider if:  You have pain that is not relieved with rest or medicine.  You have increasing pain going down  into your legs or buttocks.  Your pain does not improve after 2 weeks.  You have pain at night.  You lose weight without trying.  You have a fever or chills. Get help right away if:  You develop new bowel or bladder control problems.  You have unusual weakness or numbness in your arms or legs.  You develop nausea or vomiting.  You develop abdominal pain.  You feel faint. Summary  Acute back pain is sudden and usually short-lived.  Use proper lifting techniques. When you bend and lift, use positions that put less stress on your back.  Take over-the-counter and prescription medicines and apply heat or ice as directed by your health care provider. This information is not intended to replace advice given to you by your health care provider. Make sure you discuss any questions you have with your health care provider. Document Released: 09/15/2005 Document Revised: 04/22/2018 Document Reviewed: 04/29/2017 Elsevier Interactive Patient Education  2019 [employer] Low Back Strain  A strain is a stretch or tear in a muscle or the strong cords of tissue that attach muscle to bone (tendons). Strains of the lower back (lumbar spine) are a common cause of low back pain. A strain occurs when muscles or tendons are torn or are stretched beyond their limits. The muscles may become inflamed, resulting in pain and sudden muscle tightening (spasms). A strain can happen suddenly due to an injury (trauma), or it can develop gradually due to overuse. There are three types of strains:  Grade 1 is a mild strain involving a minor tear of the muscle fibers or tendons. This may cause some pain but no loss of muscle strength.  Grade 2 is a moderate strain involving a partial tear of the muscle fibers or tendons. This causes more severe pain and some loss of muscle strength.  Grade 3 is a severe strain involving a complete tear of the muscle or tendon. This causes severe pain and complete or nearly  complete loss of muscle strength. What are the causes? This condition may be caused by:  Trauma, such as a fall or a hit to the body.  Twisting or overstretching the back. This may result from doing activities that require a lot of energy, such as lifting heavy objects. What increases the risk? The following factors may increase your risk of getting this condition:  Playing contact sports.  Participating in sports or activities that put excessive stress on the back and require a lot of bending and twisting, including: ? Lifting weights or heavy objects. ? Gymnastics. ? Soccer. ? Figure skating. ? Snowboarding.  Being overweight or obese.  Having poor strength and flexibility. What are the signs or symptoms? Symptoms of this condition may include:  Sharp or dull pain in the lower back that does not go away. Pain may extend to the buttocks.  Stiffness.  Limited range of motion.  Inability to stand up straight due to stiffness or pain.  Muscle spasms. How is this diagnosed? This condition may be diagnosed based on:  Your symptoms.  Your medical history.  A physical exam. ? Your health care provider may push on certain areas of your back to determine the source of your pain. ? You may be asked to bend forward, backward, and side to side to assess the severity of your pain and your range of motion.  Imaging tests, such as: ? X-rays. ? MRI. How is this treated? Treatment for this condition may include:  Applying heat and cold to the affected area.  Medicines to help relieve pain and to relax your muscles (muscle relaxants).  NSAIDs to help reduce swelling and discomfort.  Physical therapy. When your symptoms improve, it is important to gradually return to your normal routine as soon as possible to reduce pain, avoid stiffness, and avoid loss of muscle strength. Generally, symptoms should improve within 6 weeks of treatment. However, recovery time varies. Follow  these instructions at home: Managing pain, stiffness, and swelling  If directed, apply ice to the injured area during the first 24 hours after your injury. ? Put ice in a plastic bag. ? Place a towel between your skin and the bag. ? Leave the ice on for 20 minutes, 2-3 times a day.  If directed, apply heat to the affected area as often as told by your health care provider. Use the heat source that your health care provider recommends, such as a moist heat pack or a heating pad. ? Place a towel between your skin and the heat source. ? Leave the heat on for 20-30 minutes. ? Remove the heat if your skin turns bright red. This is especially important if you are unable to feel pain, heat, or cold. You may have a greater risk of getting burned. Activity  Rest and return to your normal activities as told by your health care provider. Ask your health care provider what activities are safe for you.  Avoid activities that take a lot of effort (are strenuous) for as long as told by your health care provider.  Do exercises as told by your health care provider. General instructions   Take over-the-counter and prescription medicines only as told by your health care provider.  If you have questions or concerns about safety while taking pain medicine, talk with your health care provider.  Do not drive or operate heavy machinery until you know how your pain medicine affects you.  Do not use any tobacco products, such as cigarettes, chewing tobacco, and e-cigarettes. Tobacco can delay bone healing. If you need help quitting, ask your health care provider.  Keep all follow-up visits as told by your health care provider. This is important. How is this prevented?               Warm up and stretch before being active.  Cool down and stretch after being active.  Give your body time to rest between periods of activity.  Avoid: ? Being physically inactive for long periods at a  time. ? Exercising or playing sports when you are tired or in pain.  Use correct form when playing sports and lifting heavy objects.  Use good posture when sitting and standing.  Maintain a healthy weight.  Sleep on a mattress with medium firmness to support your back.  Make sure to use equipment that fits you, including shoes that fit well.  Be safe and responsible while being active to avoid falls.  Do at least 150 minutes of moderate-intensity exercise each week, such as brisk walking or water aerobics. Try a form of exercise that takes stress off your back, such as swimming or stationary cycling.  Maintain physical fitness, including: ? Strength. ? Flexibility. ? Cardiovascular fitness. ? Endurance. Contact a health care provider if:  Your back pain does not improve after 6 weeks of treatment.  Your symptoms get worse. Get help right away if:  Your back pain is severe.  You are unable to stand or walk.  You develop pain in your legs.  You develop weakness in your buttocks or legs.  You have difficulty controlling when you urinate or when you have a bowel movement. This information is not intended to replace advice given to you by your health care provider. Make sure you discuss any questions you have with your health care provider. Document Released: 09/15/2005 Document Revised: 11/10/2016 Document Reviewed: 06/27/2015 Elsevier Interactive Patient Education  2019 [employer] Carbohydrate Counting for Diabetes Mellitus, Adult  Carbohydrate counting is a method of keeping track of how many carbohydrates you eat. Eating carbohydrates naturally increases the amount of sugar (glucose) in the blood. Counting how many carbohydrates you eat helps keep your blood glucose within normal limits, which helps you manage your diabetes (diabetes mellitus). It is important to know how many carbohydrates you can safely have in each meal. This is different for every person. A diet and  nutrition specialist (registered dietitian) can help you make a meal plan and calculate how many carbohydrates you should have at each meal and snack. Carbohydrates are found in the following foods:  Grains, such as breads and cereals.  Dried beans and soy products.  Starchy vegetables, such as potatoes, peas, and corn.  Fruit and fruit juices.  Milk and yogurt.  Sweets and snack foods, such as cake, cookies, candy, chips, and soft drinks. How do I count carbohydrates? There are two ways to count carbohydrates in food. You can use either of the methods or a combination of both. Reading "Nutrition Facts" on packaged food The "Nutrition Facts" list is included on the labels of almost all packaged foods and beverages in the [place] It includes:  The serving size.  Information about nutrients in each serving, including the grams (g) of carbohydrate per serving. To use the "Nutrition Facts":  Decide how many servings you will have.  Multiply the number of servings by the number of carbohydrates per serving.  The resulting number is the total amount of carbohydrates that you will be having. Learning standard serving sizes of other foods When you eat carbohydrate foods that are not packaged or do not include "Nutrition Facts" on the label, you need to measure the servings in order to count the amount of carbohydrates:  Measure the foods that you will eat with a food scale or measuring cup, if needed.  Decide how many standard-size servings you will eat.  Multiply the number of servings by 15. Most carbohydrate-rich foods have about 15 g of carbohydrates per serving. ? For example, if you eat 8 oz (170 g) of strawberries, you will have eaten 2 servings and 30 g of carbohydrates (2 servings x 15 g = 30 g).  For foods that have more than one food mixed, such as soups and casseroles, you must count the carbohydrates in each food that is included. The following list contains standard serving  sizes of common carbohydrate-rich foods.  Each of these servings has about 15 g of carbohydrates:   hamburger bun or  English muffin.   oz (15 mL) syrup.   oz (14 g) jelly.  1 slice of bread.  1 six-inch tortilla.  3 oz (85 g) cooked rice or pasta.  4 oz (113 g) cooked dried beans.  4 oz (113 g) starchy vegetable, such as peas, corn, or potatoes.  4 oz (113 g) hot cereal.  4 oz (113 g) mashed potatoes or  of a large baked potato.  4 oz (113 g) canned or frozen fruit.  4 oz (120 mL) fruit juice.  4-6 crackers.  6 chicken nuggets.  6 oz (170 g) unsweetened dry cereal.  6 oz (170 g) plain fat-free yogurt or yogurt sweetened with artificial sweeteners.  8 oz (240 mL) milk.  8 oz (170 g) fresh fruit or one small piece of fruit.  24 oz (680 g) popped popcorn. Example of carbohydrate counting Sample meal  3 oz (85 g) chicken breast.  6 oz (170 g) brown rice.  4 oz (113 g) corn.  8 oz (240 mL) milk.  8 oz (170 g) strawberries with sugar-free whipped topping. Carbohydrate calculation 1. Identify the foods that contain carbohydrates: ? Rice. ? Corn. ? Milk. ? Strawberries. 2. Calculate how many servings you have of each food: ? 2 servings rice. ? 1 serving corn. ? 1 serving milk. ? 1 serving strawberries. 3. Multiply each number of servings by 15 g: ? 2 servings rice x 15 g = 30 g. ? 1 serving corn x 15 g = 15 g. ? 1 serving milk x 15 g = 15 g. ? 1 serving strawberries x 15 g = 15 g. 4. Add together all of the amounts to find the total grams of carbohydrates eaten: ? 30 g + 15 g + 15 g + 15 g = 75 g of carbohydrates total. Summary  Carbohydrate counting is a method of keeping track of how many carbohydrates you eat.  Eating carbohydrates naturally increases the amount of sugar (glucose) in the blood.  Counting how many carbohydrates you eat helps keep your blood glucose within normal limits, which helps you manage your diabetes.  A diet and  nutrition specialist (registered dietitian) can help you make a meal plan and calculate how many carbohydrates you should have at each meal and snack. This information is not intended to replace advice given to you by your health care provider. Make sure you discuss any questions you have with your health care provider. Document Released: 09/15/2005 Document Revised: 03/25/2017 Document Reviewed: 02/27/2016 Elsevier Interactive Patient Education  2019 [employer].

## 2019-05-27 ENCOUNTER — Other Ambulatory Visit: Payer: Self-pay | Admitting: Nurse Practitioner

## 2019-05-27 DIAGNOSIS — Z1231 Encounter for screening mammogram for malignant neoplasm of breast: Secondary | ICD-10-CM

## 2019-07-27 ENCOUNTER — Other Ambulatory Visit: Payer: Self-pay

## 2019-07-27 ENCOUNTER — Ambulatory Visit
Admission: RE | Admit: 2019-07-27 | Discharge: 2019-07-27 | Disposition: A | Discharge status: Home / Self Care | Payer: 59 | Source: Ambulatory Visit | Attending: Nurse Practitioner | Admitting: Nurse Practitioner

## 2019-07-27 DIAGNOSIS — Z1231 Encounter for screening mammogram for malignant neoplasm of breast: Secondary | ICD-10-CM

## 2019-07-29 ENCOUNTER — Other Ambulatory Visit: Payer: Self-pay

## 2019-07-29 DIAGNOSIS — Z20822 Contact with and (suspected) exposure to covid-19: Secondary | ICD-10-CM

## 2019-07-30 LAB — NOVEL CORONAVIRUS, NAA: SARS-CoV-2, NAA: NOT DETECTED

## 2020-06-20 ENCOUNTER — Other Ambulatory Visit: Payer: Self-pay | Admitting: Family Medicine

## 2020-06-20 DIAGNOSIS — Z1231 Encounter for screening mammogram for malignant neoplasm of breast: Secondary | ICD-10-CM

## 2020-07-31 ENCOUNTER — Other Ambulatory Visit: Payer: Self-pay

## 2020-07-31 ENCOUNTER — Ambulatory Visit
Admission: RE | Admit: 2020-07-31 | Discharge: 2020-07-31 | Disposition: A | Discharge status: Home / Self Care | Payer: 59 | Source: Ambulatory Visit | Attending: Family Medicine | Admitting: Family Medicine

## 2020-07-31 DIAGNOSIS — Z1231 Encounter for screening mammogram for malignant neoplasm of breast: Secondary | ICD-10-CM

## 2020-09-11 ENCOUNTER — Other Ambulatory Visit: Payer: Self-pay

## 2020-09-11 ENCOUNTER — Ambulatory Visit
Admission: RE | Admit: 2020-09-11 | Discharge: 2020-09-11 | Disposition: A | Discharge status: Home / Self Care | Payer: 59 | Source: Ambulatory Visit | Attending: Family Medicine | Admitting: Family Medicine

## 2020-10-23 ENCOUNTER — Other Ambulatory Visit: Payer: Self-pay | Admitting: Family Medicine

## 2020-11-16 ENCOUNTER — Other Ambulatory Visit: Payer: Self-pay | Admitting: Family

## 2020-11-16 DIAGNOSIS — D696 Thrombocytopenia, unspecified: Secondary | ICD-10-CM

## 2020-11-19 ENCOUNTER — Encounter: Payer: Self-pay | Admitting: Family

## 2020-11-19 ENCOUNTER — Other Ambulatory Visit: Payer: Self-pay

## 2020-11-19 ENCOUNTER — Inpatient Hospital Stay (HOSPITAL_BASED_OUTPATIENT_CLINIC_OR_DEPARTMENT_OTHER): Payer: 59 | Admitting: Family

## 2020-11-19 ENCOUNTER — Inpatient Hospital Stay: Payer: 59 | Attending: Family

## 2020-11-19 VITALS — BP 126/81 | HR 71 | Temp 98.3°F | Resp 18 | Ht 66.0 in | Wt 251.0 lb

## 2020-11-19 DIAGNOSIS — Z780 Asymptomatic menopausal state: Secondary | ICD-10-CM | POA: Insufficient documentation

## 2020-11-19 DIAGNOSIS — Z801 Family history of malignant neoplasm of trachea, bronchus and lung: Secondary | ICD-10-CM | POA: Diagnosis not present

## 2020-11-19 DIAGNOSIS — D696 Thrombocytopenia, unspecified: Secondary | ICD-10-CM | POA: Diagnosis not present

## 2020-11-19 DIAGNOSIS — Z87891 Personal history of nicotine dependence: Secondary | ICD-10-CM | POA: Insufficient documentation

## 2020-11-19 DIAGNOSIS — M170 Bilateral primary osteoarthritis of knee: Secondary | ICD-10-CM

## 2020-11-19 DIAGNOSIS — I8393 Asymptomatic varicose veins of bilateral lower extremities: Secondary | ICD-10-CM | POA: Diagnosis not present

## 2020-11-19 DIAGNOSIS — Z808 Family history of malignant neoplasm of other organs or systems: Secondary | ICD-10-CM

## 2020-11-19 DIAGNOSIS — Z803 Family history of malignant neoplasm of breast: Secondary | ICD-10-CM | POA: Diagnosis not present

## 2020-11-19 LAB — CBC WITH DIFFERENTIAL (CANCER CENTER ONLY)
Abs Immature Granulocytes: 0.02 10*3/uL (ref 0.00–0.07)
Basophils Absolute: 0 10*3/uL (ref 0.0–0.1)
Basophils Relative: 1 %
Eosinophils Absolute: 0.1 10*3/uL (ref 0.0–0.5)
Eosinophils Relative: 2 %
HCT: 43.8 % (ref 36.0–46.0)
Hemoglobin: 14.4 g/dL (ref 12.0–15.0)
Immature Granulocytes: 0 %
Lymphocytes Relative: 32 %
Lymphs Abs: 1.5 10*3/uL (ref 0.7–4.0)
MCH: 30 pg (ref 26.0–34.0)
MCHC: 32.9 g/dL (ref 30.0–36.0)
MCV: 91.3 fL (ref 80.0–100.0)
Monocytes Absolute: 0.4 10*3/uL (ref 0.1–1.0)
Monocytes Relative: 8 %
Neutro Abs: 2.6 10*3/uL (ref 1.7–7.7)
Neutrophils Relative %: 57 %
Platelet Count: 114 10*3/uL — ABNORMAL LOW (ref 150–400)
RBC: 4.8 MIL/uL (ref 3.87–5.11)
RDW: 12.6 % (ref 11.5–15.5)
WBC Count: 4.6 10*3/uL (ref 4.0–10.5)
nRBC: 0 % (ref 0.0–0.2)

## 2020-11-19 LAB — CMP (CANCER CENTER ONLY)
ALT: 10 U/L (ref 0–44)
AST: 13 U/L — ABNORMAL LOW (ref 15–41)
Albumin: 4.4 g/dL (ref 3.5–5.0)
Alkaline Phosphatase: 45 U/L (ref 38–126)
Anion gap: 8 (ref 5–15)
BUN: 32 mg/dL — ABNORMAL HIGH (ref 8–23)
CO2: 27 mmol/L (ref 22–32)
Calcium: 9.5 mg/dL (ref 8.9–10.3)
Chloride: 106 mmol/L (ref 98–111)
Creatinine: 1.18 mg/dL — ABNORMAL HIGH (ref 0.44–1.00)
GFR, Estimated: 52 mL/min — ABNORMAL LOW (ref >60)
Glucose, Bld: 111 mg/dL — ABNORMAL HIGH (ref 70–99)
Potassium: 4.5 mmol/L (ref 3.5–5.1)
Sodium: 141 mmol/L (ref 135–145)
Total Bilirubin: 0.4 mg/dL (ref 0.3–1.2)
Total Protein: 7.1 g/dL (ref 6.5–8.1)

## 2020-11-19 LAB — LACTATE DEHYDROGENASE: LDH: 153 U/L (ref 98–192)

## 2020-11-19 LAB — SAVE SMEAR (SSMR)

## 2020-11-19 LAB — PLATELET BY CITRATE

## 2020-11-19 NOTE — Progress Notes (Signed)
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  She has remained asymptomatic with this.  No issues with bleeding, abnormal bruising or petechiae.  Platelet count is stable at 114. Hgb 14.4, MCV 91 and WBC count 4.6.  She denies fatigue.  Last abdominal US was in August 2015 which showed fatty liver.  No fever, chills, n/v, cough, dizziness, SOB, chest pain, palpitations, abdominal pain or changes in bowel or bladder habits.  She has psoriasis on both ankles as well as occasional contact dermatitis.  She has occasional episodes of constipation and will take Dulcolax as needed.  Since having her gallbladder removed she also has diarrhea at times.  Along with the choley, she has also had 2 C-sections, a tonsillectomy and exploratory uterine surgery without any complications.  She has 2 children and history of 2 miscarriages (both within the first 2 - 2.5 months.  She went through menopause naturally at 65 yo. She has been on Zoloft since that time.  No hot flashes or night sweats.  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 She recent had a Kenalog injection which she feels has helped.  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 She notes some swelling that comes and goes in her ankles. No redness or pitting noted. Peal pulses are 2+.  She has history of an essential tremor on the left side. She notes occasional positional numbness and tingling in her hands that resolves with movement.  No history of diabetes or thyroid disease.  No personal history of cancer.  Mother had lung, breast and throat cancer and father had  lung cancer.  No known chemical or environmental exposures.  She quite smoking 20 years ago (had been smoking 1/2 ppd at that time).  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 No recreational drug use.  She has maintained a good appetite and is staying well hydrated. She was in a weight loss program last year and states that she lost a total of 85 lbs. Weight at this time is described as stable.  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   ROS: All other 10 point review of systems is negative.   PAST MEDICAL HISTORY:   Past Medical History:  Diagnosis Date  . Depression     ALLERGIES: Allergies  Allergen Reactions  . Atorvastatin Other (See Comments)    Muscle aches  . Erythromycin       MEDICATIONS:  Current Outpatient Medications on File Prior to Visit  Medication Sig Dispense Refill  . sertraline (ZOLOFT) 100 MG tablet Take 100 mg by mouth daily.    . meloxicam (MOBIC) 15 MG tablet meloxicam 15 mg tablet  TAKE 1 TABLET BY MOUTH EVERY DAY (Patient not taking: Reported on 11/19/2020)     No current facility-administered medications on file prior to visit.     PAST SURGICAL HISTORY Past Surgical History:  Procedure Laterality Date  . BREAST BIOPSY Right 2019   benign  . CESAREAN SECTION    . GALLBLADDER SURGERY     4 YEARS AGO?  . TONSILLECTOMY  FAMILY HISTORY: Family History  Problem Relation Age of Onset  . Breast cancer Mother 1  . Lung cancer Mother   . Throat cancer Mother   . Lung cancer Father   . Hypertension Maternal Grandmother     SOCIAL HISTORY:  reports that she has quit smoking. She has never used smokeless tobacco. She reports current alcohol use. She reports that she does not use drugs.  PERFORMANCE STATUS: The patient's performance status is 0 - Asymptomatic  PHYSICAL EXAM: Most Recent Vital Signs: Blood pressure 126/81, pulse 71, temperature 98.3 F (36.8 C), temperature source Oral, resp. rate 18, height 5\' 6"  (1.676  m), weight 251 lb (113.9 kg), SpO2 99 %. BP 126/81 (BP Location: Right Arm, Patient Position: Sitting)   Pulse 71   Temp 98.3 F (36.8 C) (Oral)   Resp 18   Ht 5\' 6"  (1.676 m)   Wt 251 lb (113.9 kg)   SpO2 99%   BMI 40.51 kg/m   General Appearance:    Alert, cooperative, no distress, appears stated age  Head:    Normocephalic, without obvious abnormality, atraumatic  Eyes:    PERRL, conjunctiva/corneas clear, EOM's intact, fundi    benign, both eyes        Throat:   Lips, mucosa, and tongue normal; teeth and gums normal  Neck:   Supple, symmetrical, trachea midline, no adenopathy;    thyroid:  no enlargement/tenderness/nodules; no carotid   bruit or JVD  Back:     Symmetric, no curvature, ROM normal, no CVA tenderness  Lungs:     Clear to auscultation bilaterally, respirations unlabored  Chest Wall:    No tenderness or deformity   Heart:    Regular rate and rhythm, S1 and S2 normal, no murmur, rub   or gallop     Abdomen:     Soft, non-tender, bowel sounds active all four quadrants,    no masses, no organomegaly        Extremities:   Extremities normal, atraumatic, no cyanosis or edema  Pulses:   2+ and symmetric all extremities  Skin:   Skin color, texture, turgor normal, no rashes or lesions  Lymph nodes:   Cervical, supraclavicular, and axillary nodes normal  Neurologic:   CNII-XII intact, normal strength, sensation and reflexes    throughout    LABORATORY DATA:  Results for orders placed or performed in visit on 11/19/20 (from the past 48 hour(s))  Platelet by Citrate     Status: None   Collection Time: 11/19/20  8:29 AM  Result Value Ref Range   Platelet CT in [facility] consistent with PLTS     Comment: Performed at [facility] at [facility], [address] [place], [address] [place]  [facility])     Status: None   Collection Time: 11/19/20  8:29 AM  Result Value Ref Range   Smear Review SMEAR STAINED AND AVAILABLE FOR REVIEW      Comment: Performed at [facility] at [facility], [address] [place], [address] [place]  Lactate dehydrogenase (LDH)     Status: None   Collection Time: 11/19/20  8:29 AM  Result Value Ref Range   LDH 153 98 - 192 U/L    Comment: Performed at [facility] at [facility], [address] [place], [address] [place]  CMP (Cancer Center only)     Status: Abnormal   Collection  Time: 11/19/20  8:29 AM  Result Value Ref Range   Sodium 141 135 - 145 mmol/L   Potassium 4.5 3.5 - 5.1 mmol/L   Chloride 106 98 - 111 mmol/L   CO2 27 22 - 32 mmol/L   Glucose, Bld 111 (H) 70 - 99 mg/dL    Comment: Glucose reference range applies only to samples taken after fasting for at least 8 hours.   BUN 32 (H) 8 - 23 mg/dL   Creatinine 1.61 (H) 0.96 - 1.00 mg/dL   Calcium 9.5 8.9 - 04.5 mg/dL   Total Protein 7.1 6.5 - 8.1 g/dL   Albumin 4.4 3.5 - 5.0 g/dL   AST 13 (L) 15 - 41 U/L   ALT 10 0 - 44 U/L   Alkaline Phosphatase 45 38 - 126 U/L   Total Bilirubin 0.4 0.3 - 1.2 mg/dL   GFR, Estimated 52 (L) >60 mL/min    Comment: (NOTE) Calculated using the CKD-EPI Creatinine Equation (2021)    Anion gap 8 5 - 15    Comment: Performed at [facility] at [facility], [address] [place], [place] [address]  CBC with Differential (Cancer Center Only)     Status: Abnormal   Collection Time: 11/19/20  8:29 AM  Result Value Ref Range   WBC Count 4.6 4.0 - 10.5 K/uL   RBC 4.80 3.87 - 5.11 MIL/uL   Hemoglobin 14.4 12.0 - 15.0 g/dL   HCT 19.1 47.8 - 29.5 %   MCV 91.3 80.0 - 100.0 fL   MCH 30.0 26.0 - 34.0 pg   MCHC 32.9 30.0 - 36.0 g/dL   RDW 62.1 30.8 - 65.7 %   Platelet Count 114 (L) 150 - 400 K/uL    Comment: EDTA platelet count consistent with citrate.   nRBC 0.0 0.0 - 0.2 %   Neutrophils Relative % 57 %   Neutro Abs 2.6 1.7 - 7.7 K/uL   Lymphocytes Relative 32 %   Lymphs Abs 1.5 0.7 - 4.0 K/uL   Monocytes Relative 8 %    Monocytes Absolute 0.4 0.1 - 1.0 K/uL   Eosinophils Relative 2 %   Eosinophils Absolute 0.1 0.0 - 0.5 K/uL   Basophils Relative 1 %   Basophils Absolute 0.0 0.0 - 0.1 K/uL   Immature Granulocytes 0 %   Abs Immature Granulocytes 0.02 0.00 - 0.07 K/uL    Comment: Performed at [facility] at [facility], [address], [place], [place] [address]      RADIOGRAPHY: No results found.     PATHOLOGY: None  ASSESSMENT/PLAN: Ms. 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 Her counts are stable at this time.  She is doing well and remains asymptomatic.  Dr. Myna Hidalgo was able to review her blood smear. No abnormality or evidence of malignancy noted.  We will continue to follow along with her and plan to see her again in 6 months.   All questions were answered and she is in agreement with the plan. She can contact our office with any questions or concerns. We can certainly see her sooner if needed.   She was discussed with Dr. Myna Hidalgo and he is in agreement with the aforementioned.   Emeline Gins, NP

## 2021-01-08 ENCOUNTER — Encounter: Payer: Self-pay | Admitting: Family

## 2021-05-20 ENCOUNTER — Inpatient Hospital Stay: Payer: 59 | Admitting: Family

## 2021-05-20 ENCOUNTER — Other Ambulatory Visit: Payer: Self-pay

## 2021-05-20 ENCOUNTER — Telehealth: Payer: Self-pay | Admitting: *Deleted

## 2021-05-20 ENCOUNTER — Encounter: Payer: Self-pay | Admitting: Family

## 2021-05-20 ENCOUNTER — Inpatient Hospital Stay: Payer: 59 | Attending: Family

## 2021-05-20 VITALS — BP 142/66 | HR 91 | Temp 98.5°F | Resp 18 | Wt 259.0 lb

## 2021-05-20 DIAGNOSIS — Z87891 Personal history of nicotine dependence: Secondary | ICD-10-CM | POA: Insufficient documentation

## 2021-05-20 DIAGNOSIS — M7989 Other specified soft tissue disorders: Secondary | ICD-10-CM | POA: Insufficient documentation

## 2021-05-20 DIAGNOSIS — D696 Thrombocytopenia, unspecified: Secondary | ICD-10-CM

## 2021-05-20 LAB — CMP (CANCER CENTER ONLY)
ALT: 11 U/L (ref 0–44)
AST: 13 U/L — ABNORMAL LOW (ref 15–41)
Albumin: 4.1 g/dL (ref 3.5–5.0)
Alkaline Phosphatase: 42 U/L (ref 38–126)
Anion gap: 10 (ref 5–15)
BUN: 19 mg/dL (ref 8–23)
CO2: 26 mmol/L (ref 22–32)
Calcium: 9.5 mg/dL (ref 8.9–10.3)
Chloride: 107 mmol/L (ref 98–111)
Creatinine: 1.18 mg/dL — ABNORMAL HIGH (ref 0.44–1.00)
GFR, Estimated: 52 mL/min — ABNORMAL LOW (ref >60)
Glucose, Bld: 109 mg/dL — ABNORMAL HIGH (ref 70–99)
Potassium: 4 mmol/L (ref 3.5–5.1)
Sodium: 143 mmol/L (ref 135–145)
Total Bilirubin: 0.5 mg/dL (ref 0.3–1.2)
Total Protein: 7.1 g/dL (ref 6.5–8.1)

## 2021-05-20 LAB — CBC WITH DIFFERENTIAL (CANCER CENTER ONLY)
Abs Immature Granulocytes: 0.01 10*3/uL (ref 0.00–0.07)
Basophils Absolute: 0 10*3/uL (ref 0.0–0.1)
Basophils Relative: 1 %
Eosinophils Absolute: 0.1 10*3/uL (ref 0.0–0.5)
Eosinophils Relative: 2 %
HCT: 43.2 % (ref 36.0–46.0)
Hemoglobin: 14.3 g/dL (ref 12.0–15.0)
Immature Granulocytes: 0 %
Lymphocytes Relative: 32 %
Lymphs Abs: 1.1 10*3/uL (ref 0.7–4.0)
MCH: 30.3 pg (ref 26.0–34.0)
MCHC: 33.1 g/dL (ref 30.0–36.0)
MCV: 91.5 fL (ref 80.0–100.0)
Monocytes Absolute: 0.2 10*3/uL (ref 0.1–1.0)
Monocytes Relative: 7 %
Neutro Abs: 2 10*3/uL (ref 1.7–7.7)
Neutrophils Relative %: 58 %
Platelet Count: 104 10*3/uL — ABNORMAL LOW (ref 150–400)
RBC: 4.72 MIL/uL (ref 3.87–5.11)
RDW: 13.1 % (ref 11.5–15.5)
WBC Count: 3.4 10*3/uL — ABNORMAL LOW (ref 4.0–10.5)
nRBC: 0 % (ref 0.0–0.2)

## 2021-05-20 LAB — SAVE SMEAR(SSMR), FOR PROVIDER SLIDE REVIEW

## 2021-05-20 LAB — LACTATE DEHYDROGENASE: LDH: 160 U/L (ref 98–192)

## 2021-05-20 NOTE — Telephone Encounter (Signed)
Per 05/20/21 LOS - gave upcoming appointments - patient to view in my chart

## 2021-05-20 NOTE — Progress Notes (Signed)
Hematology and Oncology Follow Up Visit  Denise Reed [id number] July 02, 1956 65 y.o. 05/20/2021   Principle Diagnosis:  Mild thrombocytopenia   Current Therapy:   Observation   Interim History:  Denise Reed is here today for follow-up. She is doing quite well and has no complaints at this time.  Platelets are stable at 104, Hgb 14.3, MCV 91 and WBC count is 3.4.  She had not had any issues with bleeding. No abnormal bruising, no petechiae.  She has the occasional little bit of blood in her stool with straining and hemorrhoids.  No issue with infections. No fever, chills, n/v, cough, rash, dizziness, SOB, chest pain, palpitations, abdominal pain or changes in bowel or bladder habits.  She has some mild swelling in her lower extremities that is chronic. This is unchanged from baseline. Pedal pulses are 2+.  No tenderness, numbness or tingling in her extremities.  No falls or syncope to report.  She has maintained a good appetite and is doing her best to stay well hydrated. Her weight is stable at 259 lbs.   ECOG Performance Status: 1 - Symptomatic but completely ambulatory  Medications:  Allergies as of 05/20/2021       Reactions   Atorvastatin Other (See Comments)   Muscle aches   Erythromycin         Medication List        Accurate as of May 20, 2021  8:26 AM. If you have any questions, ask your nurse or doctor.          meloxicam 15 MG tablet Commonly known as: MOBIC meloxicam 15 mg tablet  TAKE 1 TABLET BY MOUTH EVERY DAY   sertraline 100 MG tablet Commonly known as: ZOLOFT Take 100 mg by mouth daily.        Allergies:  Allergies  Allergen Reactions   Atorvastatin Other (See Comments)    Muscle aches   Erythromycin     Past Medical History, Surgical history, Social history, and Family History were reviewed and updated.  Review of Systems: All other 10 point review of systems is negative.   Physical Exam:  vitals were not taken for this  visit.   Wt Readings from Last 3 Encounters:  11/19/20 251 lb (113.9 kg)  10/27/18 (!) 320 lb (145.2 kg)  05/05/13 260 lb (117.9 kg)    Ocular: Sclerae unicteric, pupils equal, round and reactive to light Ear-nose-throat: Oropharynx clear, dentition fair Lymphatic: No cervical or supraclavicular adenopathy Lungs no rales or rhonchi, good excursion bilaterally Heart regular rate and rhythm, no murmur appreciated Abd soft, nontender, positive bowel sounds MSK no focal spinal tenderness, no joint edema Neuro: non-focal, well-oriented, appropriate affect Breasts: Deferred   Lab Results  Component Value Date   WBC 3.4 (L) 05/20/2021   HGB 14.3 05/20/2021   HCT 43.2 05/20/2021   MCV 91.5 05/20/2021   PLT 104 (L) 05/20/2021   No results found for: FERRITIN, IRON, TIBC, UIBC, IRONPCTSAT Lab Results  Component Value Date   RBC 4.72 05/20/2021   No results found for: KPAFRELGTCHN, LAMBDASER, KAPLAMBRATIO No results found for: IGGSERUM, IGA, IGMSERUM No results found for: Marda Stalker, SPEI   Chemistry      Component Value Date/Time   NA 141 11/19/2020 0829   K 4.5 11/19/2020 0829   CL 106 11/19/2020 0829   CO2 27 11/19/2020 0829   BUN 32 (H) 11/19/2020 0829   CREATININE 1.18 (H) 11/19/2020 0829   CREATININE  1.08 05/05/2013 1543      Component Value Date/Time   CALCIUM 9.5 11/19/2020 0829   ALKPHOS 45 11/19/2020 0829   AST 13 (L) 11/19/2020 0829   ALT 10 11/19/2020 0829   BILITOT 0.4 11/19/2020 0829       Impression and Plan: Denise Reed is a very pleasant 65 yo caucasian female with 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  She continues to do well and is asymptomatic at this time.  We will continue to follow along.  Follow-up in 6 months.  She can contact our office with any questions or concerns.   Emeline Gins, NP 8/22/20228:26 AM

## 2021-11-18 ENCOUNTER — Emergency Department (HOSPITAL_BASED_OUTPATIENT_CLINIC_OR_DEPARTMENT_OTHER)
Admission: EM | Admit: 2021-11-18 | Discharge: 2021-11-18 | Disposition: A | Discharge status: Home / Self Care | Payer: Medicare Other | Attending: Emergency Medicine | Admitting: Emergency Medicine

## 2021-11-18 ENCOUNTER — Emergency Department (HOSPITAL_BASED_OUTPATIENT_CLINIC_OR_DEPARTMENT_OTHER): Payer: Medicare Other

## 2021-11-18 ENCOUNTER — Other Ambulatory Visit: Payer: Self-pay

## 2021-11-18 ENCOUNTER — Encounter (HOSPITAL_BASED_OUTPATIENT_CLINIC_OR_DEPARTMENT_OTHER): Payer: Self-pay | Admitting: *Deleted

## 2021-11-18 ENCOUNTER — Encounter: Payer: Self-pay | Admitting: Family

## 2021-11-18 DIAGNOSIS — R0602 Shortness of breath: Secondary | ICD-10-CM | POA: Diagnosis present

## 2021-11-18 DIAGNOSIS — U071 COVID-19: Secondary | ICD-10-CM | POA: Diagnosis not present

## 2021-11-18 LAB — CBC WITH DIFFERENTIAL/PLATELET
Abs Immature Granulocytes: 0.04 10*3/uL (ref 0.00–0.07)
Basophils Absolute: 0 10*3/uL (ref 0.0–0.1)
Basophils Relative: 1 %
Eosinophils Absolute: 0.1 10*3/uL (ref 0.0–0.5)
Eosinophils Relative: 2 %
HCT: 44.5 % (ref 36.0–46.0)
Hemoglobin: 14.7 g/dL (ref 12.0–15.0)
Immature Granulocytes: 1 %
Lymphocytes Relative: 35 %
Lymphs Abs: 1.5 10*3/uL (ref 0.7–4.0)
MCH: 29.1 pg (ref 26.0–34.0)
MCHC: 33 g/dL (ref 30.0–36.0)
MCV: 88.1 fL (ref 80.0–100.0)
Monocytes Absolute: 0.2 10*3/uL (ref 0.1–1.0)
Monocytes Relative: 5 %
Neutro Abs: 2.5 10*3/uL (ref 1.7–7.7)
Neutrophils Relative %: 56 %
Platelets: 101 10*3/uL — ABNORMAL LOW (ref 150–400)
RBC: 5.05 MIL/uL (ref 3.87–5.11)
RDW: 13 % (ref 11.5–15.5)
Smear Review: DECREASED
WBC: 4.3 10*3/uL (ref 4.0–10.5)
nRBC: 0 % (ref 0.0–0.2)

## 2021-11-18 LAB — D-DIMER, QUANTITATIVE: D-Dimer, Quant: 0.39 ug/mL-FEU (ref 0.00–0.50)

## 2021-11-18 LAB — TROPONIN I (HIGH SENSITIVITY)
Troponin I (High Sensitivity): 2 ng/L (ref <18)
Troponin I (High Sensitivity): 3 ng/L (ref <18)

## 2021-11-18 LAB — BASIC METABOLIC PANEL
Anion gap: 7 (ref 5–15)
BUN: 19 mg/dL (ref 8–23)
CO2: 28 mmol/L (ref 22–32)
Calcium: 9.4 mg/dL (ref 8.9–10.3)
Chloride: 103 mmol/L (ref 98–111)
Creatinine, Ser: 1 mg/dL (ref 0.44–1.00)
GFR, Estimated: >60 mL/min (ref >60)
Glucose, Bld: 83 mg/dL (ref 70–99)
Potassium: 4.2 mmol/L (ref 3.5–5.1)
Sodium: 138 mmol/L (ref 135–145)

## 2021-11-18 LAB — TSH: TSH: 6.656 u[IU]/mL — ABNORMAL HIGH (ref 0.350–4.500)

## 2021-11-18 MED ORDER — IOHEXOL 350 MG/ML SOLN
100.0000 mL | Freq: Once | INTRAVENOUS | Status: AC | PRN
  Administered 2021-11-18: 70 mL via INTRAVENOUS

## 2021-11-18 NOTE — ED Provider Notes (Signed)
[facility] EMERGENCY DEPARTMENT Provider Note   CSN: [id number] Arrival date & time: 11/18/21  1428     History  Chief Complaint  Patient presents with   Covid Positive    Denise Reed is a 66 y.o. female with medical history significant for depression.  Patient presents to ED for evaluation of shortness of breath, chest pain, dizziness and headache.  Patient states that on Wednesday (2/15) she was diagnosed with COVID.  Patient states that he began experiencing symptoms on 2/12 while traveling in [place] with [employer].  Patient reports that on 2/15 she was seen by her PCP and prescribed Paxlovid.  Patient reports that she has taken the medication daily as prescribed but continues to feel ill.  She is on her last dosage today.  The patient reports that she "feels like mud" and is also stating that she is unable to warm herself up and she feels extremely tired all the time.  Patient endorses fatigue, cold intolerance, shortness of breath, chest pain, dizziness, headache, pain with deep inspiration.  Patient denies nausea, vomiting, fevers, cough, sore throat, unilateral leg swelling.  HPI     Home Medications Prior to Admission medications   Medication Sig Start Date End Date Taking? Authorizing Provider  sertraline (ZOLOFT) 100 MG tablet Take 100 mg by mouth daily.   Yes [provider]  MELATONIN PO Take 0.5 tablets by mouth at bedtime.    [provider]  meloxicam (MOBIC) 15 MG tablet     [provider]      Allergies    Atorvastatin and Erythromycin    Review of Systems   Review of Systems  Constitutional:  Positive for fatigue. Negative for chills and fever.  HENT:  Negative for sore throat.   Respiratory:  Positive for shortness of breath. Negative for cough.   Cardiovascular:  Positive for chest pain. Negative for leg swelling.  Gastrointestinal:  Negative for abdominal pain, diarrhea, nausea and vomiting.  Endocrine:  Positive for cold intolerance.  Neurological:  Positive for dizziness and headaches.  All other systems reviewed and are negative.  Physical Exam Updated Vital Signs BP (!) 167/74 (BP Location: Right Arm)    Pulse (!) 55    Temp 98.3 F (36.8 C) (Oral)    Resp 18    Ht 5\' 6"  (1.676 m)    Wt 127 kg    SpO2 100%    BMI 45.19 kg/m  Physical Exam Vitals and nursing note reviewed.  Constitutional:      General: She is not in acute distress.    Appearance: Normal appearance. She is ill-appearing. She is not toxic-appearing or diaphoretic.  HENT:     Head: Normocephalic and atraumatic.     Nose: Nose normal. No congestion.     Mouth/Throat:     Mouth: Mucous membranes are moist.  Eyes:     Extraocular Movements: Extraocular movements intact.     Conjunctiva/sclera: Conjunctivae normal.     Pupils: Pupils are equal, round, and reactive to light.  Cardiovascular:     Rate and Rhythm: Normal rate and regular rhythm.  Pulmonary:     Effort: Pulmonary effort is normal.     Breath sounds: Normal breath sounds. No wheezing or rales.  Abdominal:     General: Abdomen is flat. Bowel sounds are normal.     Palpations: Abdomen is soft.     Tenderness: There is no abdominal tenderness.  Musculoskeletal:     Cervical  back: Normal range of motion and neck supple. No rigidity or tenderness.  Skin:    General: Skin is warm and dry.     Capillary Refill: Capillary refill takes less than 2 seconds.  Neurological:     Mental Status: She is alert and oriented to person, place, and time.     GCS: GCS eye subscore is 4. GCS verbal subscore is 5. GCS motor subscore is 6.     Cranial Nerves: Cranial nerves 2-12 are intact. No dysarthria.     Sensory: Sensation is intact.     Motor: Motor function is intact. No weakness.     Coordination: Coordination is intact. Heel to [facility] Test normal.    ED Results / Procedures / Treatments   Labs (all labs ordered are listed, but only abnormal results are  displayed) Labs Reviewed  CBC WITH DIFFERENTIAL/PLATELET - Abnormal; Notable for the following components:      Result Value   Platelets 101 (*)    All other components within normal limits  BASIC METABOLIC PANEL  TSH  TROPONIN I (HIGH SENSITIVITY)    EKG EKG Interpretation  Date/Time:  Monday November 18 2021 17:36:14 EST Ventricular Rate:  59 PR Interval:  159 QRS Duration: 103 QT Interval:  422 QTC Calculation: 418 R Axis:   78 Text Interpretation: Sinus rhythm Probable left atrial enlargement RSR' in V1 or V2, right VCD or RVH No significant change since last tracing Confirmed by Susy Frizzle [phone]) on 11/18/2021 5:38:45 PM  Radiology DG Chest 2 View  Result Date: 11/18/2021 CLINICAL DATA:  Shortness of breath EXAM: CHEST - 2 VIEW COMPARISON:  04/28/2010 FINDINGS: The heart size and mediastinal contours are within normal limits. Aortic atherosclerosis. Both lungs are clear. The visualized skeletal structures are unremarkable. IMPRESSION: No active cardiopulmonary disease. Electronically Signed   By: Jasmine Pang M.D.   On: 11/18/2021 17:35    Procedures Procedures    Medications Ordered in ED Medications - No data to display  ED Course/ Medical Decision Making/ A&P                           Medical Decision Making Amount and/or Complexity of Data Reviewed Labs: ordered. Radiology: ordered.   66 year old female presents to ED for evaluation of shortness of breath, chest pain, dizziness and headache.  Patient reports that she was diagnosed with COVID on 2/15, prescribed Paxlovid which she has been taking every day since.  Patient reports that her symptoms persist even with administration of Paxlovid.  On examination, the patient is afebrile, nontachycardic, nonhypoxic with 100% oxygen saturation on room air, clear lung sounds bilaterally, soft compressible abdomen.  Patient is nontoxic in appearance.  Patient worked up utilizing following labs imaging studies  interpreted by me: - CBC unremarkable - BMP unremarkable - TSH has not yet resulted  - Troponin has not yet resulted - DG chest x-ray does not show any signs of consolidation, pneumothorax, mediastinal widening, effusions, consolidations - EKG sinus rhythm -CT angiogram has not yet been conducted  At the end of my shift, this patient has been signed out to [employer] for further disposition and management.  Final Clinical Impression(s) / ED Diagnoses Final diagnoses:  COVID-19    Rx / DC Orders ED Discharge Orders     None         Clent Ridges 11/18/21 1900    Pollyann Savoy, MD 11/18/21 [phone]

## 2021-11-18 NOTE — ED Notes (Signed)
Lab notified to please run lab for D Dimer

## 2021-11-18 NOTE — ED Notes (Signed)
IV attempted in Rt AC, blood obtained, but unable to advance IV catheter. Site WNL, dsg applied. Pt tolerated IV attempt/ blood draw well

## 2021-11-18 NOTE — ED Provider Notes (Signed)
Patient care assumed at shift change.  Complete note from previous provider is copied below.  Briefly summarize, patient presents to the ED with known COVID diagnosis having shortness of breath, chest pain, dizziness and headache.   Denise Reed is a 66 y.o. female with medical history significant for depression.  Patient presents to ED for evaluation of shortness of breath, chest pain, dizziness and headache.  Patient states that on Wednesday (2/15) she was diagnosed with COVID.  Patient states that he began experiencing symptoms on 2/12 while traveling in [place] with [employer].  Patient reports that on 2/15 she was seen by her PCP and prescribed Paxlovid.  Patient reports that she has taken the medication daily as prescribed but continues to feel ill.  She is on her last dosage today.  The patient reports that she "feels like mud" and is also stating that she is unable to warm herself up and she feels extremely tired all the time.  Patient endorses fatigue, cold intolerance, shortness of breath, chest pain, dizziness, headache, pain with deep inspiration.  Patient denies nausea, vomiting, fevers, cough, sore throat, unilateral leg swelling.  Physical Exam  BP (!) 181/76 (BP Location: Right Arm)    Pulse (!) 51    Temp 98.3 F (36.8 C) (Oral)    Resp 12    Ht 5\' 6"  (1.676 m)    Wt 127 kg    SpO2 98%    BMI 45.19 kg/m   Physical Exam Vitals and nursing note reviewed. Exam conducted with a chaperone present.  Constitutional:      General: She is not in acute distress.    Appearance: Normal appearance.  HENT:     Head: Normocephalic and atraumatic.  Eyes:     General: No scleral icterus.    Extraocular Movements: Extraocular movements intact.     Pupils: Pupils are equal, round, and reactive to light.  Skin:    Coloration: Skin is not jaundiced.  Neurological:     Mental Status: She is alert. Mental status is at baseline.     Coordination: Coordination normal.    Procedures   Procedures  ED Course / MDM    Medical Decision Making Amount and/or Complexity of Data Reviewed Labs: ordered. Radiology: ordered.  Risk Prescription drug management.   CTA negative for PE or acute pathology.  Negative troponin no ischemic findings noted on the EKG.  Patient symptoms improved.  No hypoxia, considered admission but ultimately feel patient is stable for discharge.  Encouraged her to follow-up with her specialists and primary care.    Previous provider MDM reviewed and listed below for reference:    66 year old female presents to ED for evaluation of shortness of breath, chest pain, dizziness and headache.  Patient reports that she was diagnosed with COVID on 2/15, prescribed Paxlovid which she has been taking every day since.  Patient reports that her symptoms persist even with administration of Paxlovid.   On examination, the patient is afebrile, nontachycardic, nonhypoxic with 100% oxygen saturation on room air, clear lung sounds bilaterally, soft compressible abdomen.  Patient is nontoxic in appearance.   Patient worked up utilizing following labs imaging studies interpreted by me: - CBC unremarkable - BMP unremarkable - TSH has not yet resulted  - Troponin has not yet resulted - DG chest x-ray does not show any signs of consolidation, pneumothorax, mediastinal widening, effusions, consolidations - EKG sinus rhythm -CT angiogram has not yet been conducted  Sherrill Raring, PA-C 11/19/21 2329    Truddie Hidden, MD 11/21/21 [phone]

## 2021-11-18 NOTE — ED Notes (Signed)
Pt. Feels she has been dizzy and unsure if her Covid symptoms are better.  Pt. Has no symptoms of resp. Distress and no chest pain.

## 2021-11-18 NOTE — Discharge Instructions (Signed)
Work-up today was reassuring.  Continue your home medicine.  Follow-up with primary care this week.

## 2021-11-18 NOTE — ED Triage Notes (Signed)
Covid positive. States she on her last day of Paxlovid. Sob, chest pain with cough, dizziness and headache since this am. Fatigue.

## 2021-11-19 ENCOUNTER — Other Ambulatory Visit: Payer: Self-pay | Admitting: Family Medicine

## 2021-11-19 DIAGNOSIS — Z1231 Encounter for screening mammogram for malignant neoplasm of breast: Secondary | ICD-10-CM

## 2021-11-20 ENCOUNTER — Ambulatory Visit: Payer: 59 | Admitting: Hematology & Oncology

## 2021-11-20 ENCOUNTER — Other Ambulatory Visit: Payer: 59

## 2021-12-02 ENCOUNTER — Other Ambulatory Visit: Payer: Self-pay

## 2021-12-02 ENCOUNTER — Ambulatory Visit
Admission: RE | Admit: 2021-12-02 | Discharge: 2021-12-02 | Disposition: A | Discharge status: Home / Self Care | Payer: Medicare Other | Source: Ambulatory Visit | Attending: Family Medicine | Admitting: Family Medicine

## 2021-12-02 DIAGNOSIS — Z1231 Encounter for screening mammogram for malignant neoplasm of breast: Secondary | ICD-10-CM

## 2021-12-10 ENCOUNTER — Other Ambulatory Visit: Payer: Self-pay

## 2021-12-10 ENCOUNTER — Encounter: Payer: Self-pay | Admitting: Hematology & Oncology

## 2021-12-10 ENCOUNTER — Inpatient Hospital Stay: Payer: Medicare Other | Attending: Hematology & Oncology

## 2021-12-10 ENCOUNTER — Inpatient Hospital Stay: Payer: Medicare Other | Admitting: Hematology & Oncology

## 2021-12-10 VITALS — BP 156/79 | HR 66 | Temp 98.0°F | Resp 18 | Ht 65.0 in | Wt 283.0 lb

## 2021-12-10 DIAGNOSIS — Z79899 Other long term (current) drug therapy: Secondary | ICD-10-CM | POA: Insufficient documentation

## 2021-12-10 DIAGNOSIS — D696 Thrombocytopenia, unspecified: Secondary | ICD-10-CM

## 2021-12-10 DIAGNOSIS — Z8616 Personal history of COVID-19: Secondary | ICD-10-CM | POA: Insufficient documentation

## 2021-12-10 LAB — LACTATE DEHYDROGENASE: LDH: 173 U/L (ref 98–192)

## 2021-12-10 LAB — CMP (CANCER CENTER ONLY)
ALT: 19 U/L (ref 0–44)
AST: 18 U/L (ref 15–41)
Albumin: 4.6 g/dL (ref 3.5–5.0)
Alkaline Phosphatase: 40 U/L (ref 38–126)
Anion gap: 7 (ref 5–15)
BUN: 26 mg/dL — ABNORMAL HIGH (ref 8–23)
CO2: 30 mmol/L (ref 22–32)
Calcium: 10.4 mg/dL — ABNORMAL HIGH (ref 8.9–10.3)
Chloride: 103 mmol/L (ref 98–111)
Creatinine: 1.06 mg/dL — ABNORMAL HIGH (ref 0.44–1.00)
GFR, Estimated: 58 mL/min — ABNORMAL LOW (ref >60)
Glucose, Bld: 98 mg/dL (ref 70–99)
Potassium: 4.8 mmol/L (ref 3.5–5.1)
Sodium: 140 mmol/L (ref 135–145)
Total Bilirubin: 0.4 mg/dL (ref 0.3–1.2)
Total Protein: 7.2 g/dL (ref 6.5–8.1)

## 2021-12-10 LAB — CBC WITH DIFFERENTIAL (CANCER CENTER ONLY)
Abs Immature Granulocytes: 0.03 10*3/uL (ref 0.00–0.07)
Basophils Absolute: 0 10*3/uL (ref 0.0–0.1)
Basophils Relative: 1 %
Eosinophils Absolute: 0.1 10*3/uL (ref 0.0–0.5)
Eosinophils Relative: 1 %
HCT: 43.6 % (ref 36.0–46.0)
Hemoglobin: 13.9 g/dL (ref 12.0–15.0)
Immature Granulocytes: 1 %
Lymphocytes Relative: 28 %
Lymphs Abs: 1.4 10*3/uL (ref 0.7–4.0)
MCH: 28.7 pg (ref 26.0–34.0)
MCHC: 31.9 g/dL (ref 30.0–36.0)
MCV: 89.9 fL (ref 80.0–100.0)
Monocytes Absolute: 0.3 10*3/uL (ref 0.1–1.0)
Monocytes Relative: 7 %
Neutro Abs: 3.1 10*3/uL (ref 1.7–7.7)
Neutrophils Relative %: 62 %
Platelet Count: 122 10*3/uL — ABNORMAL LOW (ref 150–400)
RBC: 4.85 MIL/uL (ref 3.87–5.11)
RDW: 13.1 % (ref 11.5–15.5)
WBC Count: 5 10*3/uL (ref 4.0–10.5)
nRBC: 0 % (ref 0.0–0.2)

## 2021-12-10 LAB — SAVE SMEAR(SSMR), FOR PROVIDER SLIDE REVIEW

## 2021-12-10 NOTE — Progress Notes (Signed)
?Hematology and Oncology Follow Up Visit ? ?Denise Reed ?[id number] ?10-Jul-1956 66 y.o. ?12/10/2021 ? ? ?Principle Diagnosis:  ?Mild thrombocytopenia  ? ?Current Therapy:   ?Observation ?  ?Interim History:  Denise Reed is here today for follow-up.  We saw her 6 months ago.  Since then, she been doing pretty well.  She is incredibly interesting to talk to.  She is working.  She was doing medical billing. ? ?She actually had COVID back in February.  She got this when she went to [facility].  She enjoyed [facility] but would not go back.  She thinks she got the COVID on the flight.  She was quite sick.  She was given Paxlovid.  She still feels somewhat fatigued. ? ?She did have a CT angiogram of the chest back in February.  This did not show a pulmonary embolism.  There is nothing in the upper abdomen that looked suspicious such as splenomegaly or cirrhosis.  There is no adenopathy. ? ?She has had no problems with nausea or vomiting.  There is been no change in bowel or bladder habits..  She recently had a mammogram done.  This all look fine. ? ?She has had no issues with rashes.  There has been no bruising. ? ?Overall, I would say performance status is ECOG 0.   ? ? ?Medications:  ?Allergies as of 12/10/2021   ? ?   Reactions  ? Atorvastatin Other (See Comments)  ? Muscle aches  ? Erythromycin Other (See Comments)  ? Rash  ? ?  ? ?  ?Medication List  ?  ? ?  ? Accurate as of December 10, 2021 12:32 PM. If you have any questions, ask your nurse or doctor.  ?  ?  ? ?  ? ?levothyroxine 50 MCG tablet ?Commonly known as: SYNTHROID ?Take 50 mcg by mouth daily. ?  ?MELATONIN PO ?Take 0.5 tablets by mouth at bedtime. ?  ?meloxicam 15 MG tablet ?Commonly known as: MOBIC ?  ?sertraline 100 MG tablet ?Commonly known as: ZOLOFT ?Take 100 mg by mouth daily. ?  ? ?  ? ? ?Allergies:  ?Allergies  ?Allergen Reactions  ? Atorvastatin Other (See Comments)  ?  Muscle aches  ? Erythromycin Other (See Comments)  ?  Rash ?  ? ? ?Past Medical  History, Surgical history, Social history, and Family History were reviewed and updated. ? ?Review of Systems: ?Review of Systems  ?Constitutional:  Positive for malaise/fatigue.  ?HENT: Negative.  Negative for hearing loss.   ?Eyes: Negative.   ?Respiratory: Negative.    ?Cardiovascular: Negative.   ?Gastrointestinal: Negative.   ?Genitourinary: Negative.   ?Musculoskeletal: Negative.   ?Skin: Negative.   ?Neurological: Negative.   ?Endo/Heme/Allergies: Negative.   ?Psychiatric/Behavioral: Negative.    ? ? ?Physical Exam: ? height is 5\' 5"  (1.651 m) and weight is 283 lb (128.4 kg). Her oral temperature is 98 ?F (36.7 ?C). Her blood pressure is 156/79 (abnormal) and her pulse is 66. Her respiration is 18 and oxygen saturation is 97%.  ? ?Wt Readings from Last 3 Encounters:  ?12/10/21 283 lb (128.4 kg)  ?11/18/21 280 lb (127 kg)  ?05/20/21 259 lb (117.5 kg)  ? ? ?Physical Exam ?Vitals reviewed.  ?HENT:  ?   Head: Normocephalic and atraumatic.  ?Eyes:  ?   Pupils: Pupils are equal, round, and reactive to light.  ?Cardiovascular:  ?   Rate and Rhythm: Normal rate and regular rhythm.  ?   Heart sounds: Normal  heart sounds.  ?Pulmonary:  ?   Effort: Pulmonary effort is normal.  ?   Breath sounds: Normal breath sounds.  ?Abdominal:  ?   General: Bowel sounds are normal.  ?   Palpations: Abdomen is soft.  ?Musculoskeletal:     ?   General: No tenderness or deformity. Normal range of motion.  ?   Cervical back: Normal range of motion.  ?Lymphadenopathy:  ?   Cervical: No cervical adenopathy.  ?Skin: ?   General: Skin is warm and dry.  ?   Findings: No erythema or rash.  ?Neurological:  ?   Mental Status: She is alert and oriented to person, place, and time.  ?Psychiatric:     ?   Behavior: Behavior normal.     ?   Thought Content: Thought content normal.     ?   Judgment: Judgment normal.  ? ? ? ?Lab Results  ?Component Value Date  ? WBC 5.0 12/10/2021  ? HGB 13.9 12/10/2021  ? HCT 43.6 12/10/2021  ? MCV 89.9 12/10/2021  ?  PLT 122 (L) 12/10/2021  ? ?No results found for: FERRITIN, IRON, TIBC, UIBC, IRONPCTSAT ?Lab Results  ?Component Value Date  ? RBC 4.85 12/10/2021  ? ?No results found for: KPAFRELGTCHN, LAMBDASER, KAPLAMBRATIO ?No results found for: IGGSERUM, IGA, IGMSERUM ?No results found for: TOTALPROTELP, ALBUMINELP, A1GS, A2GS, BETS, BETA2SER, GAMS, MSPIKE, SPEI ?  Chemistry   ?   ?Component Value Date/Time  ? NA 140 12/10/2021 1145  ? K 4.8 12/10/2021 1145  ? CL 103 12/10/2021 1145  ? CO2 30 12/10/2021 1145  ? BUN 26 (H) 12/10/2021 1145  ? CREATININE 1.06 (H) 12/10/2021 1145  ? CREATININE 1.08 05/05/2013 1543  ?    ?Component Value Date/Time  ? CALCIUM 10.4 (H) 12/10/2021 1145  ? ALKPHOS 40 12/10/2021 1145  ? AST 18 12/10/2021 1145  ? ALT 19 12/10/2021 1145  ? BILITOT 0.4 12/10/2021 1145  ?  ? ? ? ?Impression and Plan: Denise Reed is a very pleasant 66 yo caucasian female with 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  ? ?I looked at her blood smear under the microscope.  I really do not see anything that looks suspicious.  She had normal red blood cells.  There is no nucleated red blood cells.  There were no teardrop cells.  I saw no rouleaux formation.  Her white cell showed no immature myeloid or lymphoid cells.  There is no hypersegmented polys.  Platelets were adequate in number.  Platelets were well granulated.  There were a few large platelets. ? 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  Her platelet count fluctuates but really has not changed in a matter of 9 years. ? ?I do not see anything that looks suspicious on her physical exam.  The CT angiogram that she had done is quite reassuring. ? ?For right now, I think we get her back in 6 months.  I think this would be very reasonable. ?  ? ?Josph Macho, MD ?3/14/202312:32 PM  ?

## 2022-03-28 ENCOUNTER — Other Ambulatory Visit: Payer: Self-pay | Admitting: Family Medicine

## 2022-03-28 DIAGNOSIS — E2839 Other primary ovarian failure: Secondary | ICD-10-CM

## 2022-04-21 ENCOUNTER — Other Ambulatory Visit (HOSPITAL_BASED_OUTPATIENT_CLINIC_OR_DEPARTMENT_OTHER): Payer: Self-pay

## 2022-04-21 DIAGNOSIS — R0683 Snoring: Secondary | ICD-10-CM

## 2022-04-21 DIAGNOSIS — R5383 Other fatigue: Secondary | ICD-10-CM

## 2022-06-18 ENCOUNTER — Ambulatory Visit
Admission: RE | Admit: 2022-06-18 | Discharge: 2022-06-18 | Disposition: A | Discharge status: Home / Self Care | Payer: Medicare Other | Source: Ambulatory Visit | Attending: Family Medicine | Admitting: Family Medicine

## 2022-06-18 DIAGNOSIS — E2839 Other primary ovarian failure: Secondary | ICD-10-CM

## 2022-07-06 ENCOUNTER — Ambulatory Visit (HOSPITAL_BASED_OUTPATIENT_CLINIC_OR_DEPARTMENT_OTHER): Payer: Medicare Other | Attending: Family Medicine | Admitting: Internal Medicine

## 2022-07-06 VITALS — Ht 66.0 in | Wt 287.0 lb

## 2022-07-06 DIAGNOSIS — R5383 Other fatigue: Secondary | ICD-10-CM | POA: Diagnosis not present

## 2022-07-06 DIAGNOSIS — G4761 Periodic limb movement disorder: Secondary | ICD-10-CM | POA: Diagnosis not present

## 2022-07-06 DIAGNOSIS — G4733 Obstructive sleep apnea (adult) (pediatric): Secondary | ICD-10-CM | POA: Diagnosis not present

## 2022-07-06 DIAGNOSIS — R0683 Snoring: Secondary | ICD-10-CM | POA: Diagnosis present

## 2022-07-07 ENCOUNTER — Ambulatory Visit: Payer: Medicare Other | Admitting: Hematology & Oncology

## 2022-07-07 ENCOUNTER — Inpatient Hospital Stay: Payer: Medicare Other

## 2022-07-12 DIAGNOSIS — R0683 Snoring: Secondary | ICD-10-CM | POA: Diagnosis not present

## 2022-07-12 NOTE — Procedures (Signed)
       Patient Name: Denise Reed, Denise Reed Date: 07/06/2022 Gender: Female D.O.B: 12/14/1955 Age (years): 32 Referring Provider: Jolinda Croak Height (inches): 41 Interpreting Physician: Baird Lyons MD, ABSM Weight (lbs): 287 RPSGT: Gwenyth Allegra BMI: 46 MRN: [id number] Neck Size: 17.50  CLINICAL INFORMATION Sleep Study Type: NPSG Indication for sleep study: Hypertension Epworth Sleepiness Score: 5  SLEEP STUDY TECHNIQUE As per the AASM Manual for the Scoring of Sleep and Associated Events v2.3 (April 2016) with a hypopnea requiring 4% desaturations.  The channels recorded and monitored were frontal, central and occipital EEG, electrooculogram (EOG), submentalis EMG (chin), nasal and oral airflow, thoracic and abdominal wall motion, anterior tibialis EMG, snore microphone, electrocardiogram, and pulse oximetry.  MEDICATIONS Medications self-administered by patient taken the night of the study : [place] The study was initiated at 11:13:04 PM and ended at 5:18:15 AM.  Sleep onset time was 23.9 minutes and the sleep efficiency was 52.0%%. The total sleep time was 190 minutes.  Stage REM latency was 55.5 minutes.  The patient spent 20.3%% of the night in stage N1 sleep, 44.5%% in stage N2 sleep, 0.0%% in stage N3 and 35.3% in REM.  Alpha intrusion was absent.  Supine sleep was 0.00%.  RESPIRATORY PARAMETERS The overall apnea/hypopnea index (AHI) was 5.7 per hour. There were 0 total apneas, including 0 obstructive, 0 central and 0 mixed apneas. There were 18 hypopneas and 65 RERAs.  The AHI during Stage REM sleep was 10.7 per hour.  AHI while supine was N/A per hour.  The mean oxygen saturation was 93.6%. The minimum SpO2 during sleep was 87.0%.  moderate snoring was noted during this study.  CARDIAC DATA The 2 lead EKG demonstrated sinus rhythm. The mean heart rate was 57.0 beats per minute. Other EKG findings include: None.  LEG  MOVEMENT DATA The total PLMS were 0 with a resulting PLMS index of 0.0. Associated arousal with leg movement index was 4.7 .  IMPRESSIONS - Mild obstructive sleep apnea occurred during this study (AHI = 5.7/h). - Mild oxygen desaturation was noted during this study (Min O2 = 87.0%). Mean 93.6% - The patient snored with moderate snoring volume. - No cardiac abnormalities were noted during this study. - Mild limb movement sleep disturbance. 50 total (15.8/ hr), 15 with arousal (4.7/ hr).  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 Other options including CPAP, a fitted oral appliance, or ENT evaluation would be based on clinical judgment. - Consider trial of Requip or Mirapex if limb movement sleep disturbance warrants. - Be careful with alcohol, sedatives and other CNS depressants that may worsen sleep apnea and disrupt normal sleep architecture. - Sleep hygiene should be reviewed to assess factors that may improve sleep quality. - Weight management and regular exercise should be initiated or continued if appropriate.  [Electronically signed] 07/12/2022 12:26 PM  Baird Lyons MD, [place], American Board of Sleep Medicine NPI: [id number]                      [place], [place] [facility]  ELECTRONICALLY SIGNED ON:  07/12/2022, 12:16 PM [place] PH: [phone]   FX: [phone] [place]

## 2022-07-14 ENCOUNTER — Inpatient Hospital Stay: Payer: Medicare Other

## 2022-07-14 ENCOUNTER — Other Ambulatory Visit: Payer: Self-pay

## 2022-07-14 ENCOUNTER — Inpatient Hospital Stay: Payer: Medicare Other | Attending: Hematology & Oncology | Admitting: Hematology & Oncology

## 2022-07-14 ENCOUNTER — Encounter: Payer: Self-pay | Admitting: Hematology & Oncology

## 2022-07-14 VITALS — BP 119/85 | HR 61 | Temp 98.1°F | Resp 18 | Ht 66.0 in | Wt 287.0 lb

## 2022-07-14 DIAGNOSIS — Z79899 Other long term (current) drug therapy: Secondary | ICD-10-CM | POA: Insufficient documentation

## 2022-07-14 DIAGNOSIS — D696 Thrombocytopenia, unspecified: Secondary | ICD-10-CM

## 2022-07-14 LAB — CBC WITH DIFFERENTIAL (CANCER CENTER ONLY)
Abs Immature Granulocytes: 0.02 10*3/uL (ref 0.00–0.07)
Basophils Absolute: 0 10*3/uL (ref 0.0–0.1)
Basophils Relative: 1 %
Eosinophils Absolute: 0.1 10*3/uL (ref 0.0–0.5)
Eosinophils Relative: 1 %
HCT: 43.7 % (ref 36.0–46.0)
Hemoglobin: 14.1 g/dL (ref 12.0–15.0)
Immature Granulocytes: 0 %
Lymphocytes Relative: 29 %
Lymphs Abs: 1.3 10*3/uL (ref 0.7–4.0)
MCH: 28.8 pg (ref 26.0–34.0)
MCHC: 32.3 g/dL (ref 30.0–36.0)
MCV: 89.4 fL (ref 80.0–100.0)
Monocytes Absolute: 0.3 10*3/uL (ref 0.1–1.0)
Monocytes Relative: 6 %
Neutro Abs: 2.9 10*3/uL (ref 1.7–7.7)
Neutrophils Relative %: 63 %
Platelet Count: 113 10*3/uL — ABNORMAL LOW (ref 150–400)
RBC: 4.89 MIL/uL (ref 3.87–5.11)
RDW: 12.9 % (ref 11.5–15.5)
WBC Count: 4.6 10*3/uL (ref 4.0–10.5)
nRBC: 0 % (ref 0.0–0.2)

## 2022-07-14 LAB — SAVE SMEAR(SSMR), FOR PROVIDER SLIDE REVIEW

## 2022-07-14 NOTE — Progress Notes (Signed)
Hematology and Oncology Follow Up Visit  Denise Reed [id number] 1956/01/20 66 y.o. 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  We saw her 6 months ago.  She is doing quite well.  She now has a new 26-month-old grandson.  She is very excited about this.  She is still working.  She works part-time.  She does medical billing.  She feels okay.  She has had no problems with bleeding or bruising.  She has had no change in bowel or bladder habits.  There is no cough or shortness of breath.  She has had no leg swelling.  Is been no rashes.  Overall, I would say that her performance status is ECOG 0.    Medications:  Allergies as of 07/14/2022       Reactions   Atorvastatin Other (See Comments)   Muscle aches   Erythromycin Other (See Comments), Rash   Rash        Medication List        Accurate as of July 14, 2022 11:23 AM. If you have any questions, ask your nurse or doctor.          levothyroxine 75 MCG tablet Commonly known as: SYNTHROID Take 1 tablet by mouth daily. What changed: Another medication with the same name was removed. Continue taking this medication, and follow the directions you see here. Changed by: Josph Macho, MD   MELATONIN PO Take 0.5 tablets by mouth at bedtime.   meloxicam 15 MG tablet Commonly known as: MOBIC   pravastatin 20 MG tablet Commonly known as: PRAVACHOL Take 20 mg by mouth every other day.   sertraline 100 MG tablet Commonly known as: ZOLOFT Take 100 mg by mouth daily.        Allergies:  Allergies  Allergen Reactions   Atorvastatin Other (See Comments)    Muscle aches   Erythromycin Other (See Comments) and Rash    Rash    Past Medical History, Surgical history, Social history, and Family History were reviewed and updated.  Review of Systems: Review of  Systems  Constitutional:  Positive for malaise/fatigue.  HENT: Negative.  Negative for hearing loss.   Eyes: Negative.   Respiratory: Negative.    Cardiovascular: Negative.   Gastrointestinal: Negative.   Genitourinary: Negative.   Musculoskeletal: Negative.   Skin: Negative.   Neurological: Negative.   Endo/Heme/Allergies: Negative.   Psychiatric/Behavioral: Negative.       Physical Exam:  height is 5\' 6"  (1.676 m) and weight is 287 lb (130.2 kg). Her oral temperature is 98.1 F (36.7 C). Her blood pressure is 119/85 and her pulse is 61. Her respiration is 18 and oxygen saturation is 97%.   Wt Readings from Last 3 Encounters:  07/14/22 287 lb (130.2 kg)  07/06/22 287 lb (130.2 kg)  12/10/21 283 lb (128.4 kg)    Physical Exam Vitals reviewed.  HENT:     Head: Normocephalic and atraumatic.  Eyes:     Pupils: Pupils are equal, round, and reactive to light.  Cardiovascular:     Rate and Rhythm: Normal rate and regular rhythm.     Heart sounds: Normal heart sounds.  Pulmonary:     Effort: Pulmonary effort is normal.     Breath sounds: Normal breath sounds.  Abdominal:     General: Bowel sounds are normal.  Palpations: Abdomen is soft.  Musculoskeletal:        General: No tenderness or deformity. Normal range of motion.     Cervical back: Normal range of motion.  Lymphadenopathy:     Cervical: No cervical adenopathy.  Skin:    General: Skin is warm and dry.     Findings: No erythema or rash.  Neurological:     Mental Status: She is alert and oriented to person, place, and time.  Psychiatric:        Behavior: Behavior normal.        Thought Content: Thought content normal.        Judgment: Judgment normal.     Lab Results  Component Value Date   WBC 4.6 07/14/2022   HGB 14.1 07/14/2022   HCT 43.7 07/14/2022   MCV 89.4 07/14/2022   PLT 113 (L) 07/14/2022   No results found for: "FERRITIN", "IRON", "TIBC", "UIBC", "IRONPCTSAT" Lab Results  Component  Value Date   RBC 4.89 07/14/2022   No results found for: "KPAFRELGTCHN", "LAMBDASER", "KAPLAMBRATIO" No results found for: "IGGSERUM", "IGA", "IGMSERUM" No results found for: "TOTALPROTELP", "ALBUMINELP", "A1GS", "A2GS", "BETS", "BETA2SER", "GAMS", "MSPIKE", "SPEI"   Chemistry      Component Value Date/Time   NA 140 12/10/2021 1145   K 4.8 12/10/2021 1145   CL 103 12/10/2021 1145   CO2 30 12/10/2021 1145   BUN 26 (H) 12/10/2021 1145   CREATININE 1.06 (H) 12/10/2021 1145   CREATININE 1.08 05/05/2013 1543      Component Value Date/Time   CALCIUM 10.4 (H) 12/10/2021 1145   ALKPHOS 40 12/10/2021 1145   AST 18 12/10/2021 1145   ALT 19 12/10/2021 1145   BILITOT 0.4 12/10/2021 1145       Impression and Plan: Denise Reed is a very pleasant 66 yo caucasian female with 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   I looked at her blood smear under the microscope.  I really do not see anything that looks suspicious.  She had normal red blood cells.  There is no nucleated red blood cells.  There were no teardrop cells.  I saw no rouleaux formation.  Her white cell showed no immature myeloid or lymphoid cells.  There is no hypersegmented polys.  Platelets were adequate in number.  Platelets were well granulated.  There were a few large platelets.  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  Her platelet count fluctuates but really has not changed in a matter of 9 years.  I do not see anything that looks suspicious on her physical exam.   At this point time, we will get her back in 1 year.  I told her that she has any problems with bleeding, bruising or unusual rashes, to give [place] a call and we will get her back sooner.     Volanda Napoleon, MD 10/16/202311:23 AM

## 2022-11-20 ENCOUNTER — Other Ambulatory Visit: Payer: Self-pay | Admitting: Family Medicine

## 2022-11-20 DIAGNOSIS — Z1231 Encounter for screening mammogram for malignant neoplasm of breast: Secondary | ICD-10-CM

## 2023-01-07 ENCOUNTER — Ambulatory Visit
Admission: RE | Admit: 2023-01-07 | Discharge: 2023-01-07 | Disposition: A | Discharge status: Home / Self Care | Payer: Medicare Other | Source: Ambulatory Visit | Attending: Family Medicine | Admitting: Family Medicine

## 2023-01-07 DIAGNOSIS — Z1231 Encounter for screening mammogram for malignant neoplasm of breast: Secondary | ICD-10-CM

## 2023-05-25 ENCOUNTER — Emergency Department
Admission: EM | Admit: 2023-05-25 | Discharge: 2023-05-25 | Disposition: A | Discharge status: Home / Self Care | Payer: Medicare Other | Attending: Emergency Medicine | Admitting: Emergency Medicine

## 2023-05-25 ENCOUNTER — Encounter: Payer: Self-pay | Admitting: Emergency Medicine

## 2023-05-25 ENCOUNTER — Emergency Department: Payer: Medicare Other

## 2023-05-25 ENCOUNTER — Other Ambulatory Visit: Payer: Self-pay

## 2023-05-25 DIAGNOSIS — R7989 Other specified abnormal findings of blood chemistry: Secondary | ICD-10-CM | POA: Insufficient documentation

## 2023-05-25 DIAGNOSIS — R519 Headache, unspecified: Secondary | ICD-10-CM | POA: Insufficient documentation

## 2023-05-25 DIAGNOSIS — R1013 Epigastric pain: Secondary | ICD-10-CM | POA: Insufficient documentation

## 2023-05-25 DIAGNOSIS — I4510 Unspecified right bundle-branch block: Secondary | ICD-10-CM | POA: Insufficient documentation

## 2023-05-25 DIAGNOSIS — R110 Nausea: Secondary | ICD-10-CM | POA: Insufficient documentation

## 2023-05-25 DIAGNOSIS — Z1152 Encounter for screening for COVID-19: Secondary | ICD-10-CM | POA: Diagnosis not present

## 2023-05-25 LAB — CBC WITH DIFFERENTIAL/PLATELET
Abs Immature Granulocytes: 0.04 10*3/uL (ref 0.00–0.07)
Basophils Absolute: 0 10*3/uL (ref 0.0–0.1)
Basophils Relative: 1 %
Eosinophils Absolute: 0.1 10*3/uL (ref 0.0–0.5)
Eosinophils Relative: 1 %
HCT: 45.2 % (ref 36.0–46.0)
Hemoglobin: 14.7 g/dL (ref 12.0–15.0)
Immature Granulocytes: 1 %
Lymphocytes Relative: 34 %
Lymphs Abs: 2 10*3/uL (ref 0.7–4.0)
MCH: 28.5 pg (ref 26.0–34.0)
MCHC: 32.5 g/dL (ref 30.0–36.0)
MCV: 87.8 fL (ref 80.0–100.0)
Monocytes Absolute: 0.3 10*3/uL (ref 0.1–1.0)
Monocytes Relative: 5 %
Neutro Abs: 3.5 10*3/uL (ref 1.7–7.7)
Neutrophils Relative %: 58 %
Platelets: 143 10*3/uL — ABNORMAL LOW (ref 150–400)
RBC: 5.15 MIL/uL — ABNORMAL HIGH (ref 3.87–5.11)
RDW: 13 % (ref 11.5–15.5)
WBC: 6 10*3/uL (ref 4.0–10.5)
nRBC: 0 % (ref 0.0–0.2)

## 2023-05-25 LAB — URINALYSIS, W/ REFLEX TO CULTURE (INFECTION SUSPECTED)
Bacteria, UA: NONE SEEN
Bilirubin Urine: NEGATIVE
Glucose, UA: NEGATIVE mg/dL
Hgb urine dipstick: NEGATIVE
Ketones, ur: NEGATIVE mg/dL
Leukocytes,Ua: NEGATIVE
Nitrite: NEGATIVE
Protein, ur: NEGATIVE mg/dL
Specific Gravity, Urine: 1.042 — ABNORMAL HIGH (ref 1.005–1.030)
pH: 5 (ref 5.0–8.0)

## 2023-05-25 LAB — COMPREHENSIVE METABOLIC PANEL
ALT: 22 U/L (ref 0–44)
AST: 23 U/L (ref 15–41)
Albumin: 4.8 g/dL (ref 3.5–5.0)
Alkaline Phosphatase: 38 U/L (ref 38–126)
Anion gap: 15 (ref 5–15)
BUN: 21 mg/dL (ref 8–23)
CO2: 18 mmol/L — ABNORMAL LOW (ref 22–32)
Calcium: 9.9 mg/dL (ref 8.9–10.3)
Chloride: 106 mmol/L (ref 98–111)
Creatinine, Ser: 1.56 mg/dL — ABNORMAL HIGH (ref 0.44–1.00)
GFR, Estimated: 36 mL/min — ABNORMAL LOW (ref >60)
Glucose, Bld: 184 mg/dL — ABNORMAL HIGH (ref 70–99)
Potassium: 3.8 mmol/L (ref 3.5–5.1)
Sodium: 139 mmol/L (ref 135–145)
Total Bilirubin: 0.7 mg/dL (ref 0.3–1.2)
Total Protein: 8 g/dL (ref 6.5–8.1)

## 2023-05-25 LAB — CBG MONITORING, ED: Glucose-Capillary: 166 mg/dL — ABNORMAL HIGH (ref 70–99)

## 2023-05-25 LAB — TROPONIN I (HIGH SENSITIVITY)
Troponin I (High Sensitivity): 5 ng/L (ref <18)
Troponin I (High Sensitivity): 7 ng/L (ref <18)

## 2023-05-25 LAB — LIPASE, BLOOD: Lipase: 39 U/L (ref 11–51)

## 2023-05-25 LAB — SARS CORONAVIRUS 2 BY RT PCR: SARS Coronavirus 2 by RT PCR: NEGATIVE

## 2023-05-25 LAB — PROTIME-INR
INR: 1 (ref 0.8–1.2)
Prothrombin Time: 13 seconds (ref 11.4–15.2)

## 2023-05-25 LAB — APTT: aPTT: 25 seconds (ref 24–36)

## 2023-05-25 MED ORDER — ONDANSETRON HCL 4 MG/2ML IJ SOLN
4.0000 mg | Freq: Once | INTRAMUSCULAR | Status: AC
  Administered 2023-05-25: 4 mg via INTRAVENOUS
  Filled 2023-05-25: qty 2

## 2023-05-25 MED ORDER — IOHEXOL 350 MG/ML SOLN
80.0000 mL | Freq: Once | INTRAVENOUS | Status: AC | PRN
  Administered 2023-05-25: 80 mL via INTRAVENOUS

## 2023-05-25 MED ORDER — PANTOPRAZOLE SODIUM 40 MG IV SOLR
40.0000 mg | Freq: Once | INTRAVENOUS | Status: AC
  Administered 2023-05-25: 40 mg via INTRAVENOUS
  Filled 2023-05-25: qty 10

## 2023-05-25 MED ORDER — ONDANSETRON 4 MG PO TBDP: 4.0000 mg | ORAL_TABLET | Freq: Three times a day (TID) | ORAL | 0 refills | Status: AC | PRN

## 2023-05-25 MED ORDER — SODIUM CHLORIDE 0.9 % IV BOLUS
1000.0000 mL | Freq: Once | INTRAVENOUS | Status: AC
  Administered 2023-05-25: 1000 mL via INTRAVENOUS

## 2023-05-25 MED ORDER — MORPHINE SULFATE (PF) 4 MG/ML IV SOLN
4.0000 mg | Freq: Once | INTRAVENOUS | Status: AC
  Administered 2023-05-25: 4 mg via INTRAVENOUS
  Filled 2023-05-25: qty 1

## 2023-05-25 MED ORDER — DROPERIDOL 2.5 MG/ML IJ SOLN
1.2500 mg | Freq: Once | INTRAMUSCULAR | Status: AC
  Administered 2023-05-25: 1.25 mg via INTRAVENOUS
  Filled 2023-05-25: qty 2

## 2023-05-25 MED ORDER — OMEPRAZOLE MAGNESIUM 20 MG PO TBEC: 20.0000 mg | DELAYED_RELEASE_TABLET | Freq: Every day | ORAL | 0 refills | Status: AC

## 2023-05-25 NOTE — ED Provider Notes (Signed)
[facility] Provider Note    Event Date/Time   First MD Initiated Contact with Patient 05/25/23 1541     (approximate)   History   Abdominal Pain   HPI  Denise Reed is a 67 y.o. female presents to the emergency department with abdominal pain.  Patient states that she was at a funeral today when she all of a sudden felt like she was going to pass out.  Sat down and then started breaking out into a sweat with severe epigastric abdominal pain.  Received 100 mcg of fentanyl with EMS but continues to have severe pain.  Endorses nausea but no episodes of vomiting.  No prior abdominal surgery.  No alcohol or tobacco use.  Not on anticoagulation.  Denies any falls or trauma.     Physical Exam   Triage Vital Signs: ED Triage Vitals  Encounter Vitals Group     BP      Systolic BP Percentile      Diastolic BP Percentile      Pulse      Resp      Temp      Temp src      SpO2      Weight      Height      Head Circumference      Peak Flow      Pain Score      Pain Loc      Pain Education      Exclude from Growth Chart     Most recent vital signs: Vitals:   05/25/23 2200 05/25/23 2230  BP: 119/78 129/78  Pulse: (!) 54 (!) 56  Resp:    Temp:    SpO2: 99% 98%    Physical Exam Constitutional:      General: She is in acute distress.     Appearance: She is well-developed. She is ill-appearing.     Comments: Diaphoretic  HENT:     Head: Atraumatic.  Eyes:     Conjunctiva/sclera: Conjunctivae normal.  Cardiovascular:     Rate and Rhythm: Regular rhythm.  Pulmonary:     Effort: No respiratory distress.  Abdominal:     General: There is no distension.     Tenderness: There is abdominal tenderness in the epigastric area.  Musculoskeletal:        General: Normal range of motion.     Cervical back: Normal range of motion.  Skin:    General: Skin is warm.     Capillary Refill: Capillary refill takes less than 2 seconds.     Comments: Intact  and symmetric pulses in upper and lower extremities  Neurological:     General: No focal deficit present.     Mental Status: She is alert and oriented to person, place, and time. Mental status is at baseline.  Psychiatric:        Mood and Affect: Mood normal.     IMPRESSION / MDM / ASSESSMENT AND PLAN / ED COURSE  I reviewed the triage vital signs and the nursing notes.  Differential diagnosis including AAA, kidney stone, perforated peptic ulcer disease, PUD, pancreatitis, ACS, pyelonephritis.   EKG  I, Corena Herter, the attending physician, personally viewed and interpreted this ECG.   Rate: Normal  Rhythm: Normal sinus  Axis: Normal  Intervals: Normal  ST&T Change: None  No tachycardic or bradycardic dysrhythmias while on cardiac telemetry.  RADIOLOGY I independently reviewed imaging, my interpretation of imaging: No acute findings.  No signs of a dissection or AAA.  Read as no acute findings. LABS (all labs ordered are listed, but only abnormal results are displayed) Labs interpreted as -    Labs Reviewed  URINALYSIS, W/ REFLEX TO CULTURE (INFECTION SUSPECTED) - Abnormal; Notable for the following components:      Result Value   Color, Urine YELLOW (*)    APPearance CLEAR (*)    Specific Gravity, Urine 1.042 (*)    All other components within normal limits  CBC WITH DIFFERENTIAL/PLATELET - Abnormal; Notable for the following components:   RBC 5.15 (*)    Platelets 143 (*)    All other components within normal limits  COMPREHENSIVE METABOLIC PANEL - Abnormal; Notable for the following components:   CO2 18 (*)    Glucose, Bld 184 (*)    Creatinine, Ser 1.56 (*)    GFR, Estimated 36 (*)    All other components within normal limits  CBG MONITORING, ED - Abnormal; Notable for the following components:   Glucose-Capillary 166 (*)    All other components within normal limits  SARS CORONAVIRUS 2 BY RT PCR  LIPASE, BLOOD  PROTIME-INR  APTT  TROPONIN I (HIGH  SENSITIVITY)  TROPONIN I (HIGH SENSITIVITY)     MDM   Clinical Course as of 05/26/23 0104  Mon May 25, 2023  1623 Called back to CT scan to immediately go back to CT scan for concern for possible AAA.  Coming to get the patient next. [SM]    Clinical Course User Index [SM] Corena Herter, MD   On my evaluation of CTA no signs of AAA or dissection.given IV morphine and antiemetics.  Creatinine mildly elevated at 1.56 from a baseline of 1.  CO2 mildly low at 18.  UA with no signs of urinary tract infection.  COVID testing is negative.  Serial troponin was negative, low suspicion for ACS or atypical chest pain.  Chronic thrombocytopenia.  Creatinine is at baseline.  No significant electrolyte abnormalities.  CT abdomen and pelvis with no acute findings.  On reevaluation patient was given IV Protonix, continued to have severe pain with nausea and inability to tolerate p.o.  Given IV droperidol and will reevaluate  On reevaluation states she feels much better, is able to ambulate without any difficulties and is tolerating p.o.  Possible on reevaluation states she feels much better, is able to ambulate without any difficulties and is tolerating p.o.  Possible gastritis/peptic ulcer disease.  Will start the patient on a PPI and given a prescription for antiemetics.  Discussed Tylenol for pain control.  Given return precautions for any return of symptoms.  Discussed close follow-up with primary care physician and may need referral to GI.  No questions at time of discharge.  PROCEDURES:  Critical Care performed: yes  .Critical Care  Performed by: Corena Herter, MD Authorized by: Corena Herter, MD   Critical care provider statement:    Critical care time (minutes):  30   Critical care time was exclusive of:  Separately billable procedures and treating other patients   Critical care was necessary to treat or prevent imminent or life-threatening deterioration of the following conditions:   Circulatory failure   Critical care was time spent personally by me on the following activities:  Development of treatment plan with patient or surrogate, discussions with consultants, evaluation of patient's response to treatment, examination of patient, ordering and review of laboratory studies, ordering and review of radiographic studies, ordering and performing treatments and interventions, pulse  oximetry, re-evaluation of patient's condition and review of old charts   Patient's presentation is most consistent with acute presentation with potential threat to life or bodily function.   MEDICATIONS ORDERED IN ED: Medications  sodium chloride 0.9 % bolus 1,000 mL (0 mLs Intravenous Stopped 05/25/23 1921)  ondansetron (ZOFRAN) injection 4 mg (4 mg Intravenous Given 05/25/23 1606)  morphine (PF) 4 MG/ML injection 4 mg (4 mg Intravenous Given 05/25/23 1606)  iohexol (OMNIPAQUE) 350 MG/ML injection 80 mL (80 mLs Intravenous Contrast Given 05/25/23 1704)  morphine (PF) 4 MG/ML injection 4 mg (4 mg Intravenous Given 05/25/23 1800)  ondansetron (ZOFRAN) injection 4 mg (4 mg Intravenous Given 05/25/23 1759)  pantoprazole (PROTONIX) injection 40 mg (40 mg Intravenous Given 05/25/23 1919)  droperidol (INAPSINE) 2.5 MG/ML injection 1.25 mg (1.25 mg Intravenous Given 05/25/23 2103)    FINAL CLINICAL IMPRESSION(S) / ED DIAGNOSES   Final diagnoses:  Epigastric pain     Rx / DC Orders   ED Discharge Orders          Ordered    ondansetron (ZOFRAN-ODT) 4 MG disintegrating tablet  Every 8 hours PRN        05/25/23 2221    omeprazole (PRILOSEC OTC) 20 MG tablet  Daily        05/25/23 2221             Note:  This document was prepared using Dragon voice recognition software and may include unintentional dictation errors.   Corena Herter, MD 05/26/23 [phone]

## 2023-05-25 NOTE — ED Triage Notes (Signed)
Per [facility] pt coming [place] a [place] began having mid epigastric abdominal pain. States she walked to her car and had a witnessed syncopal episode. Given 100 mcg fentanyl IM. Unable to obtain IV access en route.

## 2023-05-25 NOTE — ED Notes (Signed)
Lab called to collect patient's second troponin.

## 2023-05-25 NOTE — ED Notes (Signed)
Prior to the druperidol, pt was c/o nausea every time she sat up, now pt states she feels better and her nausea is gone.  Dr. Arnoldo Morale made aware

## 2023-05-25 NOTE — Discharge Instructions (Addendum)
You were seen in the emergency department for abdominal pain.  You had a CT scan that did not show any abnormalities.  Your lab work was normal.  You could be having bad peptic ulcer disease.  You were started on an acid reducing medication and given a prescription for nausea medication.  You can take Tylenol as needed for pain control.  Follow-up closely with your primary care physician, you may need an endoscopy with GI if your symptoms continue.  Return to the emergency department if your symptoms are severe or if you have any symptoms.

## 2023-07-14 ENCOUNTER — Inpatient Hospital Stay: Payer: Medicare Other

## 2023-07-14 ENCOUNTER — Ambulatory Visit: Payer: Medicare Other | Admitting: Hematology & Oncology

## 2023-07-31 ENCOUNTER — Ambulatory Visit: Payer: Medicare Other | Admitting: Hematology & Oncology

## 2023-07-31 ENCOUNTER — Inpatient Hospital Stay: Payer: Medicare Other

## 2024-02-08 ENCOUNTER — Other Ambulatory Visit: Payer: Self-pay | Admitting: Family Medicine

## 2024-02-08 DIAGNOSIS — Z1231 Encounter for screening mammogram for malignant neoplasm of breast: Secondary | ICD-10-CM

## 2024-02-24 ENCOUNTER — Ambulatory Visit
Admission: RE | Admit: 2024-02-24 | Discharge: 2024-02-24 | Disposition: A | Discharge status: Home / Self Care | Source: Ambulatory Visit | Attending: Family Medicine | Admitting: Family Medicine

## 2024-02-24 ENCOUNTER — Ambulatory Visit

## 2024-02-24 DIAGNOSIS — Z1231 Encounter for screening mammogram for malignant neoplasm of breast: Secondary | ICD-10-CM

## 2024-07-28 ENCOUNTER — Ambulatory Visit: Admitting: Cardiology
